# Patient Record
Sex: Female | Born: 1955 | Race: Black or African American | Hispanic: No | Marital: Single | Smoking: Never smoker
Health system: Southern US, Community
[De-identification: ages and names within clinical notes are randomized; demographics above are authoritative.]

## PROBLEM LIST (undated history)

## (undated) DIAGNOSIS — G43909 Migraine, unspecified, not intractable, without status migrainosus: Secondary | ICD-10-CM

## (undated) DIAGNOSIS — E785 Hyperlipidemia, unspecified: Secondary | ICD-10-CM

## (undated) DIAGNOSIS — M5030 Other cervical disc degeneration, unspecified cervical region: Secondary | ICD-10-CM

## (undated) DIAGNOSIS — Z9889 Other specified postprocedural states: Secondary | ICD-10-CM

## (undated) DIAGNOSIS — D352 Benign neoplasm of pituitary gland: Secondary | ICD-10-CM

## (undated) DIAGNOSIS — R112 Nausea with vomiting, unspecified: Secondary | ICD-10-CM

## (undated) DIAGNOSIS — K5792 Diverticulitis of intestine, part unspecified, without perforation or abscess without bleeding: Secondary | ICD-10-CM

## (undated) DIAGNOSIS — R011 Cardiac murmur, unspecified: Secondary | ICD-10-CM

## (undated) DIAGNOSIS — M1990 Unspecified osteoarthritis, unspecified site: Secondary | ICD-10-CM

## (undated) DIAGNOSIS — E559 Vitamin D deficiency, unspecified: Secondary | ICD-10-CM

## (undated) DIAGNOSIS — H269 Unspecified cataract: Secondary | ICD-10-CM

## (undated) DIAGNOSIS — I10 Essential (primary) hypertension: Secondary | ICD-10-CM

## (undated) HISTORY — DX: Other cervical disc degeneration, unspecified cervical region: M50.30

## (undated) HISTORY — DX: Hyperlipidemia, unspecified: E78.5

## (undated) HISTORY — DX: Benign neoplasm of pituitary gland: D35.2

## (undated) HISTORY — DX: Vitamin D deficiency, unspecified: E55.9

## (undated) HISTORY — PX: EYE SURGERY: SHX253

## (undated) HISTORY — PX: OTHER SURGICAL HISTORY: SHX169

## (undated) HISTORY — PX: TUBAL LIGATION: SHX77

## (undated) HISTORY — PX: CHOLECYSTECTOMY: SHX55

---

## 2004-01-15 ENCOUNTER — Ambulatory Visit: Payer: Self-pay | Admitting: Internal Medicine

## 2004-03-02 ENCOUNTER — Ambulatory Visit: Payer: Self-pay | Admitting: Internal Medicine

## 2004-09-14 ENCOUNTER — Inpatient Hospital Stay: Payer: Self-pay | Admitting: Internal Medicine

## 2004-09-14 ENCOUNTER — Other Ambulatory Visit: Payer: Self-pay

## 2004-12-01 ENCOUNTER — Ambulatory Visit: Payer: Self-pay | Admitting: Unknown Physician Specialty

## 2005-04-26 ENCOUNTER — Ambulatory Visit: Payer: Self-pay | Admitting: Internal Medicine

## 2006-04-29 ENCOUNTER — Ambulatory Visit: Payer: Self-pay | Admitting: Internal Medicine

## 2007-05-01 ENCOUNTER — Ambulatory Visit: Payer: Self-pay | Admitting: Internal Medicine

## 2008-05-02 ENCOUNTER — Ambulatory Visit: Payer: Self-pay | Admitting: Internal Medicine

## 2009-05-06 ENCOUNTER — Ambulatory Visit: Payer: Self-pay | Admitting: Internal Medicine

## 2010-05-08 ENCOUNTER — Ambulatory Visit: Payer: Self-pay | Admitting: Internal Medicine

## 2011-02-18 ENCOUNTER — Emergency Department: Payer: Self-pay | Admitting: Unknown Physician Specialty

## 2011-02-19 LAB — CBC
HCT: 37 % (ref 35.0–47.0)
HGB: 12.1 g/dL (ref 12.0–16.0)
MCH: 28.7 pg (ref 26.0–34.0)
MCHC: 32.7 g/dL (ref 32.0–36.0)
RBC: 4.21 10*6/uL (ref 3.80–5.20)
WBC: 9.6 10*3/uL (ref 3.6–11.0)

## 2011-02-19 LAB — COMPREHENSIVE METABOLIC PANEL
Anion Gap: 10 (ref 7–16)
Calcium, Total: 9.1 mg/dL (ref 8.5–10.1)
Chloride: 106 mmol/L (ref 98–107)
Co2: 29 mmol/L (ref 21–32)
EGFR (African American): >60
Osmolality: 288 (ref 275–301)
Potassium: 3.4 mmol/L — ABNORMAL LOW (ref 3.5–5.1)
Sodium: 145 mmol/L (ref 136–145)
Total Protein: 7.6 g/dL (ref 6.4–8.2)

## 2011-02-21 ENCOUNTER — Ambulatory Visit: Payer: Self-pay | Admitting: Ophthalmology

## 2011-06-03 ENCOUNTER — Ambulatory Visit: Payer: Self-pay | Admitting: Obstetrics and Gynecology

## 2012-06-13 ENCOUNTER — Ambulatory Visit: Payer: Self-pay | Admitting: Obstetrics and Gynecology

## 2012-07-11 ENCOUNTER — Ambulatory Visit: Payer: Self-pay | Admitting: Ophthalmology

## 2013-05-11 LAB — HM PAP SMEAR: HM PAP: NEGATIVE

## 2013-06-29 ENCOUNTER — Ambulatory Visit: Payer: Self-pay | Admitting: Obstetrics and Gynecology

## 2014-05-03 NOTE — Op Note (Signed)
PATIENT NAME:  LIN, HACKMANN MR#:  [id number] DATE OF BIRTH:  [birth year]  DATE OF PROCEDURE:  07/11/2012  PREOPERATIVE DIAGNOSIS:  Visually significant cataract of the left eye.   POSTOPERATIVE DIAGNOSIS: Visually significant cataract of the left eye.   OPERATIVE PROCEDURE:  Cataract extraction by phacoemulsification with implant of intraocular lens to the left eye.   SURGEON:  Birder Robson, MD  ANESTHESIA:  1. Managed anesthesia care.  2. 50-50 mixture of 0.75% bupivacaine and 4% Xylocaine given as a retrobulbar block.   COMPLICATIONS:  None.   TECHNIQUE:  Stop and chop.  DESCRIPTION OF PROCEDURE:  The patient was examined and consented for this procedure in the preoperative holding area and then brought back to the Operating Room where the anesthesia team employed managed anesthesia care.  3.5 milliliters of the aforementioned mixture were placed in the left orbit on an Atkinson needle without complication. The left eye was then prepped and draped in the usual sterile ophthalmic fashion. A lid speculum was placed. The side-port blade was used to create a paracentesis and the anterior chamber was filled with viscoelastic. The keratome was used to create a near clear corneal incision. The continuous curvilinear capsulorrhexis was performed with a cystotome followed by the capsulorrhexis forceps. Hydrodissection and hydrodelineation were carried out with BSS on a blunt cannula. The lens was removed in a stop and chop technique. The remaining cortical material was removed with the irrigation-aspiration handpiece. The capsular bag was inflated with viscoelastic and the Tecnis ZCB00 22.5 diopter lens, serial number [id number], was placed in the capsular bag without complication. The remaining viscoelastic was removed from the eye with the irrigation-aspiration handpiece. The wounds were hydrated. The anterior chamber was flushed with Miostat and the eye was inflated to a physiologic pressure. 0.1  mL of cefuroxime concentration 10 mg/mL was placed in the anterior chamber. The wounds were found to be water tight. The eye was dressed with Vigamox followed by Maxitrol ointment and a protective shield was placed. The patient will follow up with me in one day.    ____________________________ Livingston Diones. Ferdinando Lodge, MD wlp:si D: 07/11/2012 17:26:35 ET T: 07/11/2012 20:33:02 ET JOB#: [id number]  cc: Hamp Moreland L. Willisha Sligar, MD, <Dictator> Livingston Diones Mayte Diers MD ELECTRONICALLY SIGNED 07/12/2012 15:58

## 2014-05-05 NOTE — Op Note (Signed)
PATIENT NAME:  Morgan Lam, Morgan Lam MR#:  [id number] DATE OF BIRTH:  November 14, 1955  DATE OF PROCEDURE:  02/21/2011  PROCEDURES PERFORMED:  1. Pars plana vitrectomy of the left eye.  2. Gas exchange of the left eye.  3. Endolaser of the left eye.  4. Retinal detachment repair, left eye.   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  PRIMARY SURGEON: Teresa Pelton. Zaquan Duffner, MD    ANESTHESIA: Retrobulbar block of the left eye with monitored anesthesia care.   COMPLICATIONS: None.   INDICATIONS FOR PROCEDURE: This is a patient who presented to my office with loss of central vision for the past 24 to 48 hours. Examination revealed a rhegmatogenous retinal detachment with macula detached. Risks, benefits, and alternatives of the above procedure were discussed and the patient wished to proceed.   DETAILS OF PROCEDURE: After informed consent was obtained, the patient was brought in the operative suite at [facility]. The patient was placed in supine position, was given a small dose of propofol and a retrobulbar block was performed on the left eye by the primary surgeon without any complications. Left eye was prepped and draped in a sterile manner. After lid speculum was inserted, a 25-gauge trocar was placed inferotemporally through displaced conjunctiva in an oblique fashion 4 mm beyond the limbus. The infusion cannula was turned on and inserted through the trocar and secured in position with Steri-Strips. Two more trocars were placed in a similar fashion superonasally and superotemporally. Vitreous cutter and light pipe were introduced in the eye and a core vitrectomy was performed. Peripheral vitrectomy was performed out to the ora serrata over the area of attached retina. The vitrectomy was switched to shave duty cycle and peripheral vitreous was trimmed over  the area of the retinal detachment out to the ora serrata. Three retinal tears could be identified superotemporally. The flaps for these tears were amputated using the vitreous cutter. Endo cautery was used to mark each of the retinal tears. A posterior draining retinotomy was created superotemporal to the superior arcade. An air-fluid exchange was performed through the posterior draining retinotomy and the retina completely flattened. Endolaser was introduced in the eye and four rows of laser was placed around each of the original retinal tears. Endolaser was then carried for 360 degrees with care to spare long ciliary nerves. This was given 2 to 3 rows just posterior to the ora serrata given her multiple areas of lattice. Once this was completed, any remnant fluid was drained from the posterior draining retinotomy to flatten the posterior retina. Four rows of laser was placed around the draining posterior retinotomy and any remnant fluid was removed from the posterior that had been left in order to protect the optic nerve from the air stream. Once this was completed, 22% SF6 was used as an Acupuncturist. The trocars were removed and all but one wound was noted to be airtight. The superotemporal wound was closed using a single interrupted 6-0 plain gut. Once this was done, 5 mg of dexamethasone was given into the inferior fornix and the lid speculum was removed. The eye was cleaned and pressure in the eye was confirmed to be approximately 15 mmHg. TobraDex was placed on the eye and a patch and shield was placed over the eye. The patient was taken to postanesthesia care with instructions to remain head up with a right tilt or to lie  on her right side.   ____________________________ Teresa Pelton. Starling Manns, MD mfa:drc D: 02/21/2011 10:33:55 ET T: 02/21/2011 10:45:29 ET JOB#: [id number]  cc: Teresa Pelton. Starling Manns, MD, <Dictator> Coralee Rud MD ELECTRONICALLY SIGNED 03/03/2011 7:11

## 2014-05-30 ENCOUNTER — Other Ambulatory Visit: Payer: Self-pay | Admitting: Obstetrics and Gynecology

## 2014-05-30 DIAGNOSIS — Z01419 Encounter for gynecological examination (general) (routine) without abnormal findings: Secondary | ICD-10-CM

## 2014-07-01 ENCOUNTER — Ambulatory Visit
Admission: RE | Admit: 2014-07-01 | Discharge: 2014-07-01 | Disposition: A | Discharge status: Home / Self Care | Payer: BC Managed Care – PPO | Source: Ambulatory Visit | Attending: Obstetrics and Gynecology | Admitting: Obstetrics and Gynecology

## 2014-07-01 ENCOUNTER — Other Ambulatory Visit: Payer: Self-pay | Admitting: Obstetrics and Gynecology

## 2014-07-01 DIAGNOSIS — Z1231 Encounter for screening mammogram for malignant neoplasm of breast: Secondary | ICD-10-CM

## 2014-07-01 DIAGNOSIS — Z01419 Encounter for gynecological examination (general) (routine) without abnormal findings: Secondary | ICD-10-CM

## 2014-12-03 ENCOUNTER — Encounter: Payer: Self-pay | Admitting: *Deleted

## 2014-12-04 ENCOUNTER — Ambulatory Visit: Payer: BC Managed Care – PPO | Admitting: Anesthesiology

## 2014-12-04 ENCOUNTER — Encounter: Payer: Self-pay | Admitting: *Deleted

## 2014-12-04 ENCOUNTER — Ambulatory Visit
Admission: RE | Admit: 2014-12-04 | Discharge: 2014-12-04 | Disposition: A | Discharge status: Home / Self Care | Payer: BC Managed Care – PPO | Source: Ambulatory Visit | Attending: Unknown Physician Specialty | Admitting: Unknown Physician Specialty

## 2014-12-04 ENCOUNTER — Encounter
Admission: RE | Disposition: A | Discharge status: Home / Self Care | Payer: Self-pay | Source: Ambulatory Visit | Attending: Unknown Physician Specialty

## 2014-12-04 DIAGNOSIS — K621 Rectal polyp: Secondary | ICD-10-CM | POA: Diagnosis not present

## 2014-12-04 DIAGNOSIS — K5730 Diverticulosis of large intestine without perforation or abscess without bleeding: Secondary | ICD-10-CM | POA: Insufficient documentation

## 2014-12-04 DIAGNOSIS — K640 First degree hemorrhoids: Secondary | ICD-10-CM | POA: Insufficient documentation

## 2014-12-04 DIAGNOSIS — Z1211 Encounter for screening for malignant neoplasm of colon: Secondary | ICD-10-CM | POA: Diagnosis present

## 2014-12-04 DIAGNOSIS — Z7982 Long term (current) use of aspirin: Secondary | ICD-10-CM | POA: Insufficient documentation

## 2014-12-04 HISTORY — DX: Diverticulitis of intestine, part unspecified, without perforation or abscess without bleeding: K57.92

## 2014-12-04 HISTORY — DX: Migraine, unspecified, not intractable, without status migrainosus: G43.909

## 2014-12-04 HISTORY — DX: Unspecified cataract: H26.9

## 2014-12-04 HISTORY — PX: COLONOSCOPY WITH PROPOFOL: SHX5780

## 2014-12-04 SURGERY — COLONOSCOPY WITH PROPOFOL
Anesthesia: General

## 2014-12-04 MED ORDER — PROPOFOL 500 MG/50ML IV EMUL
INTRAVENOUS | Status: DC | PRN
  Administered 2014-12-04: 160 ug/kg/min via INTRAVENOUS

## 2014-12-04 MED ORDER — LIDOCAINE HCL (CARDIAC) 20 MG/ML IV SOLN
INTRAVENOUS | Status: DC | PRN
Start: 2014-12-04 — End: 2014-12-04
  Administered 2014-12-04: 80 mg via INTRAVENOUS

## 2014-12-04 MED ORDER — FENTANYL CITRATE (PF) 100 MCG/2ML IJ SOLN
INTRAMUSCULAR | Status: DC | PRN
  Administered 2014-12-04: 50 ug via INTRAVENOUS

## 2014-12-04 MED ORDER — SODIUM CHLORIDE 0.9 % IV SOLN
INTRAVENOUS | Status: DC
  Administered 2014-12-04: 1000 mL via INTRAVENOUS

## 2014-12-04 MED ORDER — SODIUM CHLORIDE 0.9 % IV SOLN: INTRAVENOUS | Status: DC

## 2014-12-04 NOTE — Anesthesia Preprocedure Evaluation (Signed)
Anesthesia Evaluation  Patient identified by MRN, date of birth, ID band Patient awake    Reviewed: Allergy & Precautions, H&P , NPO status , Patient's Chart, lab work & pertinent test results, reviewed documented beta blocker date and time   History of Anesthesia Complications Negative for: history of anesthetic complications  Airway Mallampati: I  TM Distance: >3 FB Neck ROM: full    Dental no notable dental hx. (+) Teeth Intact   Pulmonary neg pulmonary ROS,    Pulmonary exam normal breath sounds clear to auscultation       Cardiovascular Exercise Tolerance: Good negative cardio ROS Normal cardiovascular exam Rhythm:regular Rate:Normal     Neuro/Psych  Headaches (many years ago, none recently), neg Seizures negative neurological ROS  negative psych ROS   GI/Hepatic negative GI ROS, Neg liver ROS,   Endo/Other  negative endocrine ROS  Renal/GU negative Renal ROS  negative genitourinary   Musculoskeletal   Abdominal   Peds  Hematology negative hematology ROS (+)   Anesthesia Other Findings Past Medical History:   Diverticulitis                                               Cortical cataract                                            Migraines                                                    Reproductive/Obstetrics negative OB ROS                             Anesthesia Physical Anesthesia Plan  ASA: I  Anesthesia Plan: General   Post-op Pain Management:    Induction:   Airway Management Planned:   Additional Equipment:   Intra-op Plan:   Post-operative Plan:   Informed Consent: I have reviewed the patients History and Physical, chart, labs and discussed the procedure including the risks, benefits and alternatives for the proposed anesthesia with the patient or authorized representative who has indicated his/her understanding and acceptance.   Dental Advisory  Given  Plan Discussed with: Anesthesiologist, CRNA and Surgeon  Anesthesia Plan Comments:         Anesthesia Quick Evaluation

## 2014-12-04 NOTE — Op Note (Signed)
[facility] Patient Name: Morgan Lam Procedure Date: 12/04/2014 9:04 AM MRN: [id number] Account #: [id number] Date of Birth: 1955/03/30 Admit Type: Outpatient Age: 59 Room: [facility] ENDO ROOM 4 Gender: Female Note Status: Finalized Procedure:         Colonoscopy Indications:       Screening for colorectal malignant neoplasm Providers:         Manya Silvas, MD Referring MD:      Irven Easterly. Kary Kos, MD (Referring MD) Medicines:         Propofol per Anesthesia Complications:     No immediate complications. Procedure:         Pre-Anesthesia Assessment:                    - After reviewing the risks and benefits, the patient was                     deemed in satisfactory condition to undergo the procedure.                    After obtaining informed consent, the colonoscope was                     passed under direct vision. Throughout the procedure, the                     patient's blood pressure, pulse, and oxygen saturations                     were monitored continuously. The Colonoscope was                     introduced through the anus and advanced to the the cecum,                     identified by appendiceal orifice and ileocecal valve. The                     colonoscopy was performed without difficulty. The patient                     tolerated the procedure well. The quality of the bowel                     preparation was excellent. Findings:      A small polyp was found in the rectum. The polyp was sessile. The polyp       was removed with a hot snare. Resection and retrieval were complete.      Multiple small and large-mouthed diverticula were found in the ascending       colon.      Internal hemorrhoids were found during endoscopy. The hemorrhoids were       small and Grade I (internal hemorrhoids that do not prolapse).      The exam was otherwise without abnormality. Impression:        - One small polyp in the rectum. Resected  and retrieved.                    - Diverticulosis in the ascending colon.                    - Internal hemorrhoids.                    -  The examination was otherwise normal. Recommendation:    - Await pathology results. Manya Silvas, MD 12/04/2014 9:36:29 AM This report has been signed electronically. Number of Addenda: 0 Note Initiated On: 12/04/2014 9:04 AM Scope Withdrawal Time: 0 hours 10 minutes 54 seconds  Total Procedure Duration: 0 hours 16 minutes 10 seconds       [facility]

## 2014-12-04 NOTE — H&P (Signed)
   Primary Care Physician:  Minette Headland, NP Primary Gastroenterologist:  Dr. Vira Agar  Pre-Procedure History & Physical: HPI:  Morgan Lam is a 59 y.o. female is here for an colonoscopy.   Past Medical History  Diagnosis Date  . Diverticulitis   . Cortical cataract   . Migraines     Past Surgical History  Procedure Laterality Date  . Cholecystectomy    . Tubal ligation    . Degenerative disc    . Eye surgery      cataract os    Prior to Admission medications   Medication Sig Start Date End Date Taking? Authorizing Provider  aspirin 81 MG tablet Take 81 mg by mouth daily.   Yes Historical Provider, MD  Multiple Vitamin (MULTIVITAMIN) tablet Take 1 tablet by mouth daily.   Yes Historical Provider, MD  Vitamin D, Ergocalciferol, (DRISDOL) 50000 UNITS CAPS capsule Take 50,000 Units by mouth every 7 (seven) days.   Yes Historical Provider, MD    Allergies as of 11/11/2014  . (Not on File)    History reviewed. No pertinent family history.  Social History   Social History  . Marital Status: Single    Spouse Name: N/A  . Number of Children: N/A  . Years of Education: N/A   Occupational History  . Not on file.   Social History Main Topics  . Smoking status: Never Smoker   . Smokeless tobacco: Not on file  . Alcohol Use: Not on file  . Drug Use: Not on file  . Sexual Activity: Not on file   Other Topics Concern  . Not on file   Social History Narrative    Review of Systems: See HPI, otherwise negative ROS  Physical Exam: BP 150/86 mmHg  Pulse 79  Temp(Src) 98.1 F (36.7 C) (Oral)  Resp 16  Ht 5\' 7"  (1.702 m)  Wt 61.236 kg (135 lb)  BMI 21.14 kg/m2  SpO2 100% General:   Alert,  pleasant and cooperative in NAD Head:  Normocephalic and atraumatic. Neck:  Supple; no masses or thyromegaly. Lungs:  Clear throughout to auscultation.    Heart:  Regular rate and rhythm. Abdomen:  Soft, nontender and nondistended. Normal bowel sounds, without  guarding, and without rebound.   Neurologic:  Alert and  oriented x4;  grossly normal neurologically.  Impression/Plan: KATRESE PETILLO is here for an colonoscopy to be performed for screening colonoscopy  Risks, benefits, limitations, and alternatives regarding  colonoscopy have been reviewed with the patient.  Questions have been answered.  All parties agreeable.   Gaylyn Cheers, MD  12/04/2014, 9:02 AM

## 2014-12-04 NOTE — Transfer of Care (Signed)
Immediate Anesthesia Transfer of Care Note  Patient: Morgan Lam  Procedure(s) Performed: Procedure(s): COLONOSCOPY WITH PROPOFOL (N/A)  Patient Location: PACU and Endoscopy Unit  Anesthesia Type:General  Level of Consciousness: sedated  Airway & Oxygen Therapy: Patient Spontanous Breathing and Patient connected to nasal cannula oxygen  Post-op Assessment: Report given to RN and Post -op Vital signs reviewed and stable  Post vital signs: Reviewed and stable  Last Vitals: 9:37 70 hr 14resp 100% sat 122/72 96.9 temp Filed Vitals:   12/04/14 0814  BP: 150/86  Pulse: 79  Temp: 36.7 C  Resp: 16    Complications: No apparent anesthesia complications

## 2014-12-04 NOTE — Anesthesia Postprocedure Evaluation (Signed)
Anesthesia Post Note  Patient: Morgan Lam  Procedure(s) Performed: Procedure(s) (LRB): COLONOSCOPY WITH PROPOFOL (N/A)  Patient location during evaluation: PACU Anesthesia Type: General Level of consciousness: awake and alert Pain management: pain level controlled Vital Signs Assessment: post-procedure vital signs reviewed and stable Respiratory status: spontaneous breathing, nonlabored ventilation, respiratory function stable and patient connected to nasal cannula oxygen Cardiovascular status: blood pressure returned to baseline and stable Postop Assessment: No signs of nausea or vomiting Anesthetic complications: no    Last Vitals:  Filed Vitals:   12/04/14 0956 12/04/14 1006  BP: 147/81 155/74  Pulse: 63 68  Temp:    Resp: 12 12    Last Pain: There were no vitals filed for this visit.               Martha Clan

## 2014-12-06 LAB — SURGICAL PATHOLOGY

## 2014-12-09 ENCOUNTER — Encounter: Payer: Self-pay | Admitting: Unknown Physician Specialty

## 2015-06-05 ENCOUNTER — Encounter: Payer: Self-pay | Admitting: Obstetrics and Gynecology

## 2015-07-03 ENCOUNTER — Encounter: Payer: Self-pay | Admitting: Obstetrics and Gynecology

## 2015-07-21 ENCOUNTER — Other Ambulatory Visit: Payer: Self-pay | Admitting: Adult Health

## 2015-07-21 DIAGNOSIS — Z1231 Encounter for screening mammogram for malignant neoplasm of breast: Secondary | ICD-10-CM

## 2015-07-23 ENCOUNTER — Ambulatory Visit (INDEPENDENT_AMBULATORY_CARE_PROVIDER_SITE_OTHER): Payer: BC Managed Care – PPO | Admitting: Obstetrics and Gynecology

## 2015-07-23 ENCOUNTER — Encounter: Payer: Self-pay | Admitting: Obstetrics and Gynecology

## 2015-07-23 VITALS — BP 168/69 | HR 73 | Ht 67.0 in | Wt 135.7 lb

## 2015-07-23 DIAGNOSIS — Z1211 Encounter for screening for malignant neoplasm of colon: Secondary | ICD-10-CM

## 2015-07-23 DIAGNOSIS — N952 Postmenopausal atrophic vaginitis: Secondary | ICD-10-CM | POA: Diagnosis not present

## 2015-07-23 DIAGNOSIS — Z780 Asymptomatic menopausal state: Secondary | ICD-10-CM | POA: Diagnosis not present

## 2015-07-23 DIAGNOSIS — Z0000 Encounter for general adult medical examination without abnormal findings: Secondary | ICD-10-CM

## 2015-07-23 DIAGNOSIS — Z01419 Encounter for gynecological examination (general) (routine) without abnormal findings: Secondary | ICD-10-CM

## 2015-07-23 NOTE — Patient Instructions (Signed)
1. No Pap smear needed until 2018 2. Mammogram already scheduled 3. Stool guaiac cards for colon cancer screening given 4. Continue with calcium and vitamin D supplementation 5. Continue with healthy eating and exercise. 6. Return in 1 year for annual exam

## 2015-07-23 NOTE — Progress Notes (Signed)
Patient ID: Morgan Lam, female   DOB: [birth year], 60 y.o.   MRN: [id number] ANNUAL PREVENTATIVE CARE GYN  ENCOUNTER NOTE  Subjective:       Morgan Lam is a 60 y.o. G1P1001 female here for a routine annual gynecologic exam.  Current complaints: 1.  none    Gynecologic History No LMP recorded. Patient is postmenopausal. Contraception: post menopausal status Last Pap: 2015. Results were: normal Last mammogram: 2016. Results were: normal  Obstetric History OB History  Gravida Para Term Preterm AB SAB TAB Ectopic Multiple Living  1 1 1       1     # Outcome Date GA Lbr Len/2nd Weight Sex Delivery Anes PTL Lv  1 Term 1991   7 lb (3.175 kg) M VBAC   Y      Past Medical History  Diagnosis Date  . Diverticulitis   . Cortical cataract   . Migraines   . Hyperlipemia   . DDD (degenerative disc disease), cervical   . Pituitary adenoma ([place])   . Vitamin D deficiency     Past Surgical History  Procedure Laterality Date  . Cholecystectomy    . Tubal ligation    . Degenerative disc    . Eye surgery      cataract os  . Colonoscopy with propofol N/A 12/04/2014    Procedure: COLONOSCOPY WITH PROPOFOL;  Surgeon: Manya Silvas, MD;  Location: [facility] ENDOSCOPY;  Service: Endoscopy;  Laterality: N/A;    Current Outpatient Prescriptions on File Prior to Visit  Medication Sig Dispense Refill  . aspirin 81 MG tablet Take 81 mg by mouth daily.    . Multiple Vitamin (MULTIVITAMIN) tablet Take 1 tablet by mouth daily.    . Vitamin D, Ergocalciferol, (DRISDOL) 50000 UNITS CAPS capsule Take 50,000 Units by mouth every 7 (seven) days.     No current facility-administered medications on file prior to visit.    Allergies  Allergen Reactions  . Codeine   . Ivp Dye [Iodinated Diagnostic Agents]     Social History   Social History  . Marital Status: Single    Spouse Name: N/A  . Number of Children: N/A  . Years of Education: N/A   Occupational History  . Not on file.   Social  History Main Topics  . Smoking status: Never Smoker   . Smokeless tobacco: Not on file  . Alcohol Use: Yes     Comment: occas  . Drug Use: Yes    Special: Marijuana  . Sexual Activity: Yes   Other Topics Concern  . Not on file   Social History Narrative    Family History  Problem Relation Age of Onset  . Diabetes Maternal Uncle   . Stomach cancer Maternal Uncle   . Cancer Neg Hx     The following portions of the patient's history were reviewed and updated as appropriate: allergies, current medications, past family history, past medical history, past social history, past surgical history and problem list.  Review of Systems ROS Review of Systems - General ROS: negative for - chills, fatigue, fever, hot flashes, night sweats, weight gain or weight loss Psychological ROS: negative for - anxiety, decreased libido, depression, mood swings, physical abuse or sexual abuse Ophthalmic ROS: negative for - blurry vision, eye pain or loss of vision ENT ROS: negative for - headaches, hearing change, visual changes or vocal changes Allergy and Immunology ROS: negative for - hives, itchy/watery eyes or seasonal allergies Hematological and Lymphatic ROS:  negative for - bleeding problems, bruising, swollen lymph nodes or weight loss Endocrine ROS: negative for - galactorrhea, hair pattern changes, hot flashes, malaise/lethargy, mood swings, palpitations, polydipsia/polyuria, skin changes, temperature intolerance or unexpected weight changes Breast ROS: negative for - new or changing breast lumps or nipple discharge Respiratory ROS: negative for - cough or shortness of breath Cardiovascular ROS: negative for - chest pain, irregular heartbeat, palpitations or shortness of breath Gastrointestinal ROS: no abdominal pain, change in bowel habits, or black or bloody stools Genito-Urinary ROS: no dysuria, trouble voiding, or hematuria Musculoskeletal ROS: negative for - joint pain or joint  stiffness Neurological ROS: negative for - bowel and bladder control changes Dermatological ROS: negative for rash and skin lesion changes   Objective:   BP 168/69 mmHg  Pulse 73  Ht 5\' 7"  (1.702 m)  Wt 135 lb 11.2 oz (61.553 kg)  BMI 21.25 kg/m2 CONSTITUTIONAL: Well-developed, well-nourished female in no acute distress.  PSYCHIATRIC: Normal mood and affect. Normal behavior. Normal judgment and thought content. [place]: Alert and oriented to person, place, and time. Normal muscle tone coordination. No cranial nerve deficit noted. HENT:  Normocephalic, atraumatic, External right and left ear normal. Oropharynx is clear and moist EYES: Conjunctivae and EOM are normal. Pupils are equal, round, and reactive to light. No scleral icterus.  NECK: Normal range of motion, supple, no masses.  Normal thyroid.  SKIN: Skin is warm and dry. No rash noted. Not diaphoretic. No erythema. No pallor. CARDIOVASCULAR: Normal heart rate noted, regular rhythm, no murmur. RESPIRATORY: Clear to auscultation bilaterally. Effort and breath sounds normal, no problems with respiration noted. BREASTS: Symmetric in size. No masses, skin changes, nipple drainage, or lymphadenopathy. ABDOMEN: Soft, normal bowel sounds, no distention noted.  No tenderness, rebound or guarding.  BLADDER: Normal PELVIC:  External Genitalia: Normal  BUS: Normal  Vagina: Moderate vaginal atrophy  Cervix: Atrophic; no lesions  Uterus: Small, mobile, nontender  Adnexa: Normal  RV: External Exam NormaI, No Rectal Masses and Normal Sphincter tone  MUSCULOSKELETAL: Normal range of motion. No tenderness.  No cyanosis, clubbing, or edema.  2+ distal pulses. LYMPHATIC: No Axillary, Supraclavicular, or Inguinal Adenopathy.    Assessment:   Annual gynecologic examination 60 y.o. Contraception: post menopausal status Normal BMI Problem List Items Addressed This Visit    None      Plan:  Pap: Not needed Mammogram: scheduled  07/2014 Stool Guaiac Testing:  Ordered Labs: Per primary care Routine preventative health maintenance measures emphasized: Exercise/Diet/Weight control, Tobacco Warnings and Alcohol/Substance use risks Return to [place], CMA  Brayton Mars, MD   Note: This dictation was prepared with Dragon dictation along with smaller phrase technology. Any transcriptional errors that result from this process are unintentional.

## 2015-08-08 ENCOUNTER — Other Ambulatory Visit: Payer: Self-pay | Admitting: Adult Health

## 2015-08-08 ENCOUNTER — Ambulatory Visit
Admission: RE | Admit: 2015-08-08 | Discharge: 2015-08-08 | Disposition: A | Discharge status: Home / Self Care | Payer: BC Managed Care – PPO | Source: Ambulatory Visit | Attending: Adult Health | Admitting: Adult Health

## 2015-08-08 DIAGNOSIS — Z1231 Encounter for screening mammogram for malignant neoplasm of breast: Secondary | ICD-10-CM | POA: Diagnosis present

## 2015-08-09 LAB — FECAL OCCULT BLOOD, IMMUNOCHEMICAL: Fecal Occult Bld: NEGATIVE

## 2015-08-26 ENCOUNTER — Telehealth: Payer: Self-pay

## 2015-08-26 NOTE — Telephone Encounter (Signed)
Pt aware.

## 2015-08-26 NOTE — Telephone Encounter (Signed)
-----   Message from Brayton Mars, MD sent at 08/26/2015  6:41 AM EDT ----- Please Notify - Labs normal

## 2016-01-15 ENCOUNTER — Other Ambulatory Visit: Payer: Self-pay | Admitting: Family Medicine

## 2016-01-15 DIAGNOSIS — R29818 Other symptoms and signs involving the nervous system: Secondary | ICD-10-CM

## 2016-01-15 DIAGNOSIS — R202 Paresthesia of skin: Secondary | ICD-10-CM

## 2016-01-15 DIAGNOSIS — R29898 Other symptoms and signs involving the musculoskeletal system: Secondary | ICD-10-CM

## 2016-01-23 ENCOUNTER — Other Ambulatory Visit
Admission: RE | Admit: 2016-01-23 | Discharge: 2016-01-23 | Disposition: A | Discharge status: Home / Self Care | Payer: BC Managed Care – PPO | Source: Ambulatory Visit | Attending: Family Medicine | Admitting: Family Medicine

## 2016-01-23 ENCOUNTER — Ambulatory Visit
Admission: RE | Admit: 2016-01-23 | Discharge: 2016-01-23 | Disposition: A | Discharge status: Home / Self Care | Payer: BC Managed Care – PPO | Source: Ambulatory Visit | Attending: Family Medicine | Admitting: Family Medicine

## 2016-01-23 DIAGNOSIS — R202 Paresthesia of skin: Secondary | ICD-10-CM | POA: Diagnosis not present

## 2016-01-23 DIAGNOSIS — R29898 Other symptoms and signs involving the musculoskeletal system: Secondary | ICD-10-CM | POA: Diagnosis present

## 2016-01-23 DIAGNOSIS — Z01812 Encounter for preprocedural laboratory examination: Secondary | ICD-10-CM | POA: Diagnosis present

## 2016-01-23 DIAGNOSIS — R29818 Other symptoms and signs involving the nervous system: Secondary | ICD-10-CM | POA: Insufficient documentation

## 2016-01-23 DIAGNOSIS — Z79899 Other long term (current) drug therapy: Secondary | ICD-10-CM | POA: Diagnosis not present

## 2016-01-23 LAB — CREATININE, SERUM
CREATININE: 0.73 mg/dL (ref 0.44–1.00)
GFR calc non Af Amer: >60 mL/min (ref >60)

## 2016-01-23 LAB — BUN: BUN: 9 mg/dL (ref 6–20)

## 2016-01-23 MED ORDER — GADOBENATE DIMEGLUMINE 529 MG/ML IV SOLN
12.0000 mL | Freq: Once | INTRAVENOUS | Status: AC | PRN
  Administered 2016-01-23: 12 mL via INTRAVENOUS

## 2016-02-13 ENCOUNTER — Other Ambulatory Visit: Payer: Self-pay | Admitting: Neurology

## 2016-02-13 DIAGNOSIS — R202 Paresthesia of skin: Secondary | ICD-10-CM

## 2016-02-14 DIAGNOSIS — M1611 Unilateral primary osteoarthritis, right hip: Secondary | ICD-10-CM | POA: Insufficient documentation

## 2016-02-17 ENCOUNTER — Ambulatory Visit
Admission: RE | Admit: 2016-02-17 | Discharge: 2016-02-17 | Disposition: A | Discharge status: Home / Self Care | Payer: BC Managed Care – PPO | Source: Ambulatory Visit | Attending: Neurology | Admitting: Neurology

## 2016-02-17 DIAGNOSIS — M47892 Other spondylosis, cervical region: Secondary | ICD-10-CM | POA: Diagnosis not present

## 2016-02-17 DIAGNOSIS — M5031 Other cervical disc degeneration,  high cervical region: Secondary | ICD-10-CM | POA: Insufficient documentation

## 2016-02-17 DIAGNOSIS — R202 Paresthesia of skin: Secondary | ICD-10-CM | POA: Diagnosis not present

## 2016-02-17 DIAGNOSIS — R938 Abnormal findings on diagnostic imaging of other specified body structures: Secondary | ICD-10-CM | POA: Diagnosis not present

## 2016-02-17 DIAGNOSIS — R9082 White matter disease, unspecified: Secondary | ICD-10-CM | POA: Insufficient documentation

## 2016-02-17 DIAGNOSIS — M5134 Other intervertebral disc degeneration, thoracic region: Secondary | ICD-10-CM | POA: Diagnosis not present

## 2016-03-01 ENCOUNTER — Ambulatory Visit
Admission: RE | Admit: 2016-03-01 | Discharge: 2016-03-01 | Disposition: A | Discharge status: Home / Self Care | Payer: BC Managed Care – PPO | Source: Ambulatory Visit | Attending: Neurosurgery | Admitting: Neurosurgery

## 2016-03-01 ENCOUNTER — Encounter
Admission: RE | Admit: 2016-03-01 | Discharge: 2016-03-01 | Disposition: A | Discharge status: Home / Self Care | Payer: BC Managed Care – PPO | Source: Ambulatory Visit | Attending: Neurosurgery | Admitting: Neurosurgery

## 2016-03-01 DIAGNOSIS — Z01810 Encounter for preprocedural cardiovascular examination: Secondary | ICD-10-CM | POA: Diagnosis not present

## 2016-03-01 DIAGNOSIS — R918 Other nonspecific abnormal finding of lung field: Secondary | ICD-10-CM | POA: Diagnosis not present

## 2016-03-01 DIAGNOSIS — Z01812 Encounter for preprocedural laboratory examination: Secondary | ICD-10-CM | POA: Insufficient documentation

## 2016-03-01 DIAGNOSIS — Z01818 Encounter for other preprocedural examination: Secondary | ICD-10-CM

## 2016-03-01 DIAGNOSIS — M5000 Cervical disc disorder with myelopathy, unspecified cervical region: Secondary | ICD-10-CM | POA: Insufficient documentation

## 2016-03-01 HISTORY — DX: Other specified postprocedural states: Z98.890

## 2016-03-01 HISTORY — DX: Nausea with vomiting, unspecified: R11.2

## 2016-03-01 LAB — DIFFERENTIAL
BASOS PCT: 1 %
Basophils Absolute: 0 10*3/uL (ref 0–0.1)
EOS PCT: 5 %
Eosinophils Absolute: 0.3 10*3/uL (ref 0–0.7)
Lymphocytes Relative: 39 %
Lymphs Abs: 2.5 10*3/uL (ref 1.0–3.6)
MONO ABS: 0.5 10*3/uL (ref 0.2–0.9)
Monocytes Relative: 8 %
NEUTROS ABS: 3 10*3/uL (ref 1.4–6.5)
Neutrophils Relative %: 47 %

## 2016-03-01 LAB — URINALYSIS, ROUTINE W REFLEX MICROSCOPIC
BILIRUBIN URINE: NEGATIVE
GLUCOSE, UA: 50 mg/dL — AB
HGB URINE DIPSTICK: NEGATIVE
Ketones, ur: NEGATIVE mg/dL
Leukocytes, UA: NEGATIVE
Nitrite: NEGATIVE
PH: 5 (ref 5.0–8.0)
Protein, ur: NEGATIVE mg/dL
SPECIFIC GRAVITY, URINE: 1.02 (ref 1.005–1.030)

## 2016-03-01 LAB — PROTIME-INR
INR: 1.02
PROTHROMBIN TIME: 13.4 s (ref 11.4–15.2)

## 2016-03-01 LAB — CBC
HCT: 36.7 % (ref 35.0–47.0)
Hemoglobin: 12 g/dL (ref 12.0–16.0)
MCH: 28.1 pg (ref 26.0–34.0)
MCHC: 32.7 g/dL (ref 32.0–36.0)
MCV: 85.9 fL (ref 80.0–100.0)
PLATELETS: 219 10*3/uL (ref 150–440)
RBC: 4.27 MIL/uL (ref 3.80–5.20)
RDW: 13.9 % (ref 11.5–14.5)
WBC: 6.4 10*3/uL (ref 3.6–11.0)

## 2016-03-01 LAB — BASIC METABOLIC PANEL
Anion gap: 7 (ref 5–15)
BUN: 10 mg/dL (ref 6–20)
CHLORIDE: 106 mmol/L (ref 101–111)
CO2: 29 mmol/L (ref 22–32)
CREATININE: 0.74 mg/dL (ref 0.44–1.00)
Calcium: 9.1 mg/dL (ref 8.9–10.3)
GFR calc Af Amer: >60 mL/min (ref >60)
GFR calc non Af Amer: >60 mL/min (ref >60)
Glucose, Bld: 72 mg/dL (ref 65–99)
Potassium: 3.2 mmol/L — ABNORMAL LOW (ref 3.5–5.1)
Sodium: 142 mmol/L (ref 135–145)

## 2016-03-01 LAB — TYPE AND SCREEN
ABO/RH(D): A POS
Antibody Screen: NEGATIVE

## 2016-03-01 LAB — APTT: aPTT: 32 seconds (ref 24–36)

## 2016-03-01 LAB — SURGICAL PCR SCREEN
MRSA, PCR: NEGATIVE
Staphylococcus aureus: NEGATIVE

## 2016-03-01 NOTE — Pre-Procedure Instructions (Signed)
Dr. Lacinda Axon ordered Ancef wt based, called Pharmacy, spoke with Violeta Gelinas RPh,  he calculated Ancef as 2 grams IVPB pre-op and 1 gram IVPB every 6 hours for 24 hours after surgery starting 2 hours after surgery.

## 2016-03-01 NOTE — Patient Instructions (Signed)
  Your procedure is scheduled [id number] Feb. 26 , 2018. Report to Same Day Surgery. To find out your arrival time please call [phone] between 1PM - 3PM on Friday Feb.23, 2018.  Remember: Instructions that are not followed completely may result in serious medical risk, up to and including death, or upon the discretion of your surgeon and anesthesiologist your surgery may need to be rescheduled.    _x___ 1. Do not eat food or drink liquids after midnight. No gum chewing or hard candies.     _x___ 2. No Alcohol for 24 hours before or after surgery.   ____ 3. Bring all medications with you on the day of surgery if instructed.    __x__ 4. Notify your doctor if there is any change in your medical condition     (cold, fever, infections).    _____ 5. No smoking 24 hours prior to surgery.     Do not wear jewelry, make-up, hairpins, clips or nail polish.  Do not wear lotions, powders, or perfumes.   Do not shave 48 hours prior to surgery. Men may shave face and neck.  Do not bring valuables to the hospital.    [facility] is not responsible for any belongings or valuables.               Contacts, dentures or bridgework may not be worn into surgery.  Leave your suitcase in the car. After surgery it may be brought to your room.  For patients admitted to the hospital, discharge time is determined by your treatment team.   Patients discharged the day of surgery will not be allowed to drive home.    Please read over the following fact sheets that you were given:   [facility] Preparing for Surgery  ____ Take these medicines the morning of surgery with A SIP OF WATER: None     ____ Fleet Enema (as directed)   __x__ Use CHG Soap as directed on instruction sheet  ____ Use inhalers on the day of surgery and bring to hospital day of surgery  ____ Stop metformin 2 days prior to surgery    ____ Take 1/2 of usual insulin dose the night before surgery and none on the morning of surgery.    _x___ Stop aspirin 1 week prior to surgery per Dr. Jonathon Jordan instructions.  _x___ Stop Anti-inflammatories such as Advil, Aleve, Ibuprofen, Motrin, Naproxen,  Naprosyn, Goodies powders or aspirin products. OK to take Tylenol.   ____ Stop supplements until after surgery.    ____ Bring C-Pap to the hospital.

## 2016-03-01 NOTE — Pre-Procedure Instructions (Signed)
Potassium level low at 3.2.  Faxed request for potassium supplement to [facility] at Dr. Geralynn Ochs office.  Spoke with Ophelia Shoulder, she will forward this information onto pt's PCP at [facility].

## 2016-03-07 MED ORDER — CEFAZOLIN SODIUM-DEXTROSE 2-4 GM/100ML-% IV SOLN
2.0000 g | Freq: Once | INTRAVENOUS | Status: AC
  Administered 2016-03-08: 2 g via INTRAVENOUS

## 2016-03-08 ENCOUNTER — Encounter: Payer: Self-pay | Admitting: *Deleted

## 2016-03-08 ENCOUNTER — Observation Stay
Admission: RE | Admit: 2016-03-08 | Discharge: 2016-03-09 | Disposition: A | Discharge status: Home / Self Care | Payer: BC Managed Care – PPO | Source: Ambulatory Visit | Attending: Neurosurgery | Admitting: Neurosurgery

## 2016-03-08 ENCOUNTER — Ambulatory Visit: Payer: BC Managed Care – PPO

## 2016-03-08 ENCOUNTER — Encounter
Admission: RE | Disposition: A | Discharge status: Home / Self Care | Payer: Self-pay | Source: Ambulatory Visit | Attending: Neurosurgery

## 2016-03-08 ENCOUNTER — Observation Stay: Payer: BC Managed Care – PPO

## 2016-03-08 ENCOUNTER — Ambulatory Visit: Payer: BC Managed Care – PPO | Admitting: Anesthesiology

## 2016-03-08 DIAGNOSIS — M4802 Spinal stenosis, cervical region: Secondary | ICD-10-CM | POA: Diagnosis not present

## 2016-03-08 DIAGNOSIS — Z9889 Other specified postprocedural states: Secondary | ICD-10-CM | POA: Diagnosis not present

## 2016-03-08 DIAGNOSIS — Z886 Allergy status to analgesic agent status: Secondary | ICD-10-CM | POA: Diagnosis not present

## 2016-03-08 DIAGNOSIS — Z9842 Cataract extraction status, left eye: Secondary | ICD-10-CM | POA: Diagnosis not present

## 2016-03-08 DIAGNOSIS — Z833 Family history of diabetes mellitus: Secondary | ICD-10-CM | POA: Diagnosis not present

## 2016-03-08 DIAGNOSIS — E559 Vitamin D deficiency, unspecified: Secondary | ICD-10-CM | POA: Insufficient documentation

## 2016-03-08 DIAGNOSIS — Z9049 Acquired absence of other specified parts of digestive tract: Secondary | ICD-10-CM | POA: Diagnosis not present

## 2016-03-08 DIAGNOSIS — Z800 Family history of malignant neoplasm of digestive organs: Secondary | ICD-10-CM | POA: Insufficient documentation

## 2016-03-08 DIAGNOSIS — Z7982 Long term (current) use of aspirin: Secondary | ICD-10-CM | POA: Insufficient documentation

## 2016-03-08 DIAGNOSIS — Z8639 Personal history of other endocrine, nutritional and metabolic disease: Secondary | ICD-10-CM | POA: Diagnosis not present

## 2016-03-08 DIAGNOSIS — Z91041 Radiographic dye allergy status: Secondary | ICD-10-CM | POA: Diagnosis not present

## 2016-03-08 DIAGNOSIS — M5000 Cervical disc disorder with myelopathy, unspecified cervical region: Secondary | ICD-10-CM | POA: Diagnosis present

## 2016-03-08 DIAGNOSIS — Z09 Encounter for follow-up examination after completed treatment for conditions other than malignant neoplasm: Secondary | ICD-10-CM

## 2016-03-08 DIAGNOSIS — Z419 Encounter for procedure for purposes other than remedying health state, unspecified: Secondary | ICD-10-CM

## 2016-03-08 DIAGNOSIS — G959 Disease of spinal cord, unspecified: Secondary | ICD-10-CM | POA: Diagnosis present

## 2016-03-08 DIAGNOSIS — M4322 Fusion of spine, cervical region: Secondary | ICD-10-CM

## 2016-03-08 HISTORY — PX: ANTERIOR CERVICAL DECOMP/DISCECTOMY FUSION: SHX1161

## 2016-03-08 LAB — POCT I-STAT 4, (NA,K, GLUC, HGB,HCT)
GLUCOSE: 106 mg/dL — AB (ref 65–99)
HEMATOCRIT: 37 % (ref 36.0–46.0)
HEMOGLOBIN: 12.6 g/dL (ref 12.0–15.0)
POTASSIUM: 3.4 mmol/L — AB (ref 3.5–5.1)
SODIUM: 143 mmol/L (ref 135–145)

## 2016-03-08 LAB — ABO/RH: ABO/RH(D): A POS

## 2016-03-08 SURGERY — ANTERIOR CERVICAL DECOMPRESSION/DISCECTOMY FUSION 3 LEVELS
Anesthesia: General | Site: Spine Cervical | Laterality: Left | Wound class: Clean

## 2016-03-08 MED ORDER — CEFAZOLIN SODIUM-DEXTROSE 2-3 GM-% IV SOLR
2.0000 g | Freq: Three times a day (TID) | INTRAVENOUS | Status: AC
  Administered 2016-03-08 (×2): 2 g via INTRAVENOUS
  Filled 2016-03-08 (×2): qty 50

## 2016-03-08 MED ORDER — SODIUM CHLORIDE 0.9 % IJ SOLN
INTRAMUSCULAR | Status: AC
  Filled 2016-03-08: qty 50

## 2016-03-08 MED ORDER — SODIUM CHLORIDE 0.9 % IV SOLN: 250.0000 mL | INTRAVENOUS | Status: DC

## 2016-03-08 MED ORDER — SODIUM CHLORIDE 0.9 % IJ SOLN
INTRAMUSCULAR | Status: AC
  Filled 2016-03-08: qty 10

## 2016-03-08 MED ORDER — OXYCODONE HCL 5 MG PO TABS
5.0000 mg | ORAL_TABLET | Freq: Four times a day (QID) | ORAL | Status: DC | PRN
  Administered 2016-03-08: 5 mg via ORAL
  Filled 2016-03-08: qty 1

## 2016-03-08 MED ORDER — MENTHOL 3 MG MT LOZG
1.0000 | LOZENGE | OROMUCOSAL | Status: DC | PRN
  Administered 2016-03-08: 3 mg via ORAL
  Filled 2016-03-08 (×2): qty 9

## 2016-03-08 MED ORDER — CEFAZOLIN SODIUM-DEXTROSE 2-4 GM/100ML-% IV SOLN
INTRAVENOUS | Status: AC
  Filled 2016-03-08: qty 100

## 2016-03-08 MED ORDER — LACTATED RINGERS IV SOLN
INTRAVENOUS | Status: DC
  Administered 2016-03-08: 07:00:00 via INTRAVENOUS

## 2016-03-08 MED ORDER — HYDRALAZINE HCL 20 MG/ML IJ SOLN: 10.0000 mg | Freq: Four times a day (QID) | INTRAMUSCULAR | Status: DC | PRN

## 2016-03-08 MED ORDER — ACETAMINOPHEN 325 MG PO TABS: 650.0000 mg | ORAL_TABLET | ORAL | Status: DC | PRN

## 2016-03-08 MED ORDER — PROPOFOL 10 MG/ML IV BOLUS
INTRAVENOUS | Status: DC | PRN
  Administered 2016-03-08: 50 mg via INTRAVENOUS
  Administered 2016-03-08: 100 mg via INTRAVENOUS
  Administered 2016-03-08: 50 mg via INTRAVENOUS

## 2016-03-08 MED ORDER — CEFAZOLIN SODIUM-DEXTROSE 2-4 GM/100ML-% IV SOLN
2.0000 g | Freq: Three times a day (TID) | INTRAVENOUS | Status: DC
  Filled 2016-03-08 (×2): qty 100

## 2016-03-08 MED ORDER — DEXAMETHASONE SODIUM PHOSPHATE 10 MG/ML IJ SOLN
INTRAMUSCULAR | Status: DC | PRN
  Administered 2016-03-08: 10 mg via INTRAVENOUS

## 2016-03-08 MED ORDER — THROMBIN 5000 UNITS EX KIT
PACK | CUTANEOUS | Status: DC | PRN
  Administered 2016-03-08 (×2): 5000 [IU] via TOPICAL

## 2016-03-08 MED ORDER — SODIUM CHLORIDE 0.9 % IV SOLN
INTRAVENOUS | Status: DC
  Administered 2016-03-08 – 2016-03-09 (×2): via INTRAVENOUS

## 2016-03-08 MED ORDER — GLYCOPYRROLATE 0.2 MG/ML IJ SOLN
INTRAMUSCULAR | Status: DC | PRN
  Administered 2016-03-08: 0.2 mg via INTRAVENOUS

## 2016-03-08 MED ORDER — ONDANSETRON HCL 4 MG/2ML IJ SOLN
4.0000 mg | INTRAMUSCULAR | Status: DC | PRN
  Administered 2016-03-08 (×2): 4 mg via INTRAVENOUS
  Filled 2016-03-08 (×2): qty 2

## 2016-03-08 MED ORDER — MIDAZOLAM HCL 2 MG/2ML IJ SOLN
INTRAMUSCULAR | Status: AC
  Filled 2016-03-08: qty 2

## 2016-03-08 MED ORDER — PROPOFOL 10 MG/ML IV BOLUS
INTRAVENOUS | Status: AC
  Filled 2016-03-08: qty 20

## 2016-03-08 MED ORDER — BACITRACIN 50000 UNITS IM SOLR
INTRAMUSCULAR | Status: DC | PRN
  Administered 2016-03-08: 1000 mL

## 2016-03-08 MED ORDER — SODIUM CHLORIDE 0.9 % IV SOLN
INTRAVENOUS | Status: DC | PRN
  Administered 2016-03-08: .2 ug/kg/min via INTRAVENOUS

## 2016-03-08 MED ORDER — FAMOTIDINE 20 MG PO TABS
ORAL_TABLET | ORAL | Status: AC
  Administered 2016-03-08: 20 mg via ORAL
  Filled 2016-03-08: qty 1

## 2016-03-08 MED ORDER — SODIUM CHLORIDE 0.9 % IV SOLN
INTRAVENOUS | Status: DC | PRN
  Administered 2016-03-08: 60 ug/min via INTRAVENOUS

## 2016-03-08 MED ORDER — METHOCARBAMOL 500 MG PO TABS: 500.0000 mg | ORAL_TABLET | Freq: Three times a day (TID) | ORAL | Status: DC | PRN

## 2016-03-08 MED ORDER — LIDOCAINE HCL (CARDIAC) 20 MG/ML IV SOLN
INTRAVENOUS | Status: DC | PRN
  Administered 2016-03-08: 60 mg via INTRAVENOUS
  Administered 2016-03-08: 40 mg via INTRAVENOUS

## 2016-03-08 MED ORDER — HYDROMORPHONE HCL 1 MG/ML IJ SOLN
INTRAMUSCULAR | Status: AC
  Administered 2016-03-08: 0.5 mg via INTRAVENOUS
  Filled 2016-03-08: qty 1

## 2016-03-08 MED ORDER — ACETAMINOPHEN 325 MG PO TABS
650.0000 mg | ORAL_TABLET | Freq: Four times a day (QID) | ORAL | Status: DC | PRN
  Administered 2016-03-09: 650 mg via ORAL
  Filled 2016-03-08: qty 2

## 2016-03-08 MED ORDER — ACETAMINOPHEN 10 MG/ML IV SOLN
INTRAVENOUS | Status: DC | PRN
  Administered 2016-03-08: 1000 mg via INTRAVENOUS

## 2016-03-08 MED ORDER — CEFAZOLIN SODIUM-DEXTROSE 2-4 GM/100ML-% IV SOLN
2.0000 g | Freq: Three times a day (TID) | INTRAVENOUS | Status: DC
  Filled 2016-03-08 (×3): qty 100

## 2016-03-08 MED ORDER — SODIUM CHLORIDE 0.9% FLUSH
3.0000 mL | Freq: Two times a day (BID) | INTRAVENOUS | Status: DC
  Administered 2016-03-08: 3 mL via INTRAVENOUS

## 2016-03-08 MED ORDER — SODIUM CHLORIDE FLUSH 0.9 % IV SOLN
INTRAVENOUS | Status: AC
  Filled 2016-03-08: qty 10

## 2016-03-08 MED ORDER — SUCCINYLCHOLINE CHLORIDE 20 MG/ML IJ SOLN
INTRAMUSCULAR | Status: DC | PRN
  Administered 2016-03-08: 80 mg via INTRAVENOUS

## 2016-03-08 MED ORDER — ONDANSETRON HCL 4 MG/2ML IJ SOLN
INTRAMUSCULAR | Status: DC | PRN
  Administered 2016-03-08: 4 mg via INTRAVENOUS

## 2016-03-08 MED ORDER — ADULT MULTIVITAMIN W/MINERALS CH
1.0000 | ORAL_TABLET | Freq: Every day | ORAL | Status: DC
  Administered 2016-03-09: 1 via ORAL
  Filled 2016-03-08: qty 1

## 2016-03-08 MED ORDER — REMIFENTANIL HCL 1 MG IV SOLR
INTRAVENOUS | Status: AC
  Filled 2016-03-08: qty 1000

## 2016-03-08 MED ORDER — MORPHINE SULFATE (PF) 2 MG/ML IV SOLN
2.0000 mg | INTRAVENOUS | Status: DC | PRN
  Administered 2016-03-08: 2 mg via INTRAVENOUS
  Filled 2016-03-08: qty 1

## 2016-03-08 MED ORDER — PROPOFOL 500 MG/50ML IV EMUL
INTRAVENOUS | Status: DC | PRN
  Administered 2016-03-08: 75 ug/kg/min via INTRAVENOUS

## 2016-03-08 MED ORDER — SODIUM CHLORIDE 0.9% FLUSH: 3.0000 mL | INTRAVENOUS | Status: DC | PRN

## 2016-03-08 MED ORDER — FENTANYL CITRATE (PF) 100 MCG/2ML IJ SOLN
INTRAMUSCULAR | Status: DC | PRN
  Administered 2016-03-08: 50 ug via INTRAVENOUS
  Administered 2016-03-08 (×2): 25 ug via INTRAVENOUS
  Administered 2016-03-08: 50 ug via INTRAVENOUS
  Administered 2016-03-08: 100 ug via INTRAVENOUS

## 2016-03-08 MED ORDER — REMIFENTANIL HCL 1 MG IV SOLR
INTRAVENOUS | Status: AC
  Filled 2016-03-08: qty 2000

## 2016-03-08 MED ORDER — CALCIUM CARBONATE-VITAMIN D 500-200 MG-UNIT PO TABS
1.0000 | ORAL_TABLET | Freq: Every day | ORAL | Status: DC
  Administered 2016-03-09: 10:00:00 1 via ORAL
  Filled 2016-03-08 (×2): qty 1

## 2016-03-08 MED ORDER — FENTANYL CITRATE (PF) 100 MCG/2ML IJ SOLN
INTRAMUSCULAR | Status: AC
  Administered 2016-03-08: 25 ug via INTRAVENOUS
  Filled 2016-03-08: qty 2

## 2016-03-08 MED ORDER — ACETAMINOPHEN 650 MG RE SUPP
650.0000 mg | Freq: Four times a day (QID) | RECTAL | Status: DC | PRN
Start: 2016-03-08 — End: 2016-03-09

## 2016-03-08 MED ORDER — HYDROMORPHONE HCL 1 MG/ML IJ SOLN
0.5000 mg | INTRAMUSCULAR | Status: AC | PRN
  Administered 2016-03-08 (×4): 0.5 mg via INTRAVENOUS

## 2016-03-08 MED ORDER — MIDAZOLAM HCL 2 MG/2ML IJ SOLN
INTRAMUSCULAR | Status: DC | PRN
  Administered 2016-03-08: 2 mg via INTRAVENOUS

## 2016-03-08 MED ORDER — FENTANYL CITRATE (PF) 100 MCG/2ML IJ SOLN
25.0000 ug | INTRAMUSCULAR | Status: DC | PRN
  Administered 2016-03-08 (×4): 25 ug via INTRAVENOUS

## 2016-03-08 MED ORDER — ACETAMINOPHEN 650 MG RE SUPP: 650.0000 mg | RECTAL | Status: DC | PRN

## 2016-03-08 MED ORDER — BACITRACIN 50000 UNITS IM SOLR
INTRAMUSCULAR | Status: AC
  Filled 2016-03-08: qty 1

## 2016-03-08 MED ORDER — PHENOL 1.4 % MT LIQD
1.0000 | OROMUCOSAL | Status: DC | PRN
  Filled 2016-03-08 (×2): qty 177

## 2016-03-08 MED ORDER — SENNA 8.6 MG PO TABS
1.0000 | ORAL_TABLET | Freq: Two times a day (BID) | ORAL | Status: DC
  Administered 2016-03-09: 8.6 mg via ORAL
  Filled 2016-03-08: qty 1

## 2016-03-08 MED ORDER — THROMBIN 5000 UNITS EX SOLR
CUTANEOUS | Status: AC
  Filled 2016-03-08: qty 5000

## 2016-03-08 MED ORDER — PHENYLEPHRINE HCL 10 MG/ML IJ SOLN
INTRAMUSCULAR | Status: DC | PRN
  Administered 2016-03-08: 200 ug via INTRAVENOUS
  Administered 2016-03-08: 100 ug via INTRAVENOUS
  Administered 2016-03-08: 200 ug via INTRAVENOUS

## 2016-03-08 MED ORDER — PROPOFOL 500 MG/50ML IV EMUL
INTRAVENOUS | Status: AC
  Filled 2016-03-08: qty 100

## 2016-03-08 MED ORDER — METOPROLOL TARTRATE 5 MG/5ML IV SOLN
INTRAVENOUS | Status: DC | PRN
  Administered 2016-03-08: 1 mg via INTRAVENOUS
  Administered 2016-03-08: 2 mg via INTRAVENOUS

## 2016-03-08 MED ORDER — SCOPOLAMINE 1 MG/3DAYS TD PT72
1.0000 | MEDICATED_PATCH | TRANSDERMAL | Status: DC
  Administered 2016-03-08: 1.5 mg via TRANSDERMAL
  Filled 2016-03-08: qty 1

## 2016-03-08 MED ORDER — FENTANYL CITRATE (PF) 250 MCG/5ML IJ SOLN
INTRAMUSCULAR | Status: AC
  Filled 2016-03-08: qty 5

## 2016-03-08 MED ORDER — FAMOTIDINE 20 MG PO TABS
20.0000 mg | ORAL_TABLET | Freq: Once | ORAL | Status: AC
  Administered 2016-03-08: 20 mg via ORAL

## 2016-03-08 MED ORDER — LIDOCAINE-EPINEPHRINE 1 %-1:100000 IJ SOLN
INTRAMUSCULAR | Status: AC
  Filled 2016-03-08: qty 1

## 2016-03-08 MED ORDER — PROMETHAZINE HCL 25 MG/ML IJ SOLN: 6.2500 mg | INTRAMUSCULAR | Status: DC | PRN

## 2016-03-08 MED ORDER — ACETAMINOPHEN 10 MG/ML IV SOLN
INTRAVENOUS | Status: AC
  Filled 2016-03-08: qty 100

## 2016-03-08 MED ORDER — LIDOCAINE-EPINEPHRINE 1 %-1:100000 IJ SOLN
INTRAMUSCULAR | Status: DC | PRN
Start: 2016-03-08 — End: 2016-03-08
  Administered 2016-03-08: 10 mL

## 2016-03-08 MED ORDER — METOCLOPRAMIDE HCL 5 MG/ML IJ SOLN
5.0000 mg | Freq: Once | INTRAMUSCULAR | Status: AC
  Administered 2016-03-08: 5 mg via INTRAVENOUS
  Filled 2016-03-08: qty 2

## 2016-03-08 MED FILL — Thrombin For Soln 5000 Unit: CUTANEOUS | Qty: 5000 | Status: AC

## 2016-03-08 SURGICAL SUPPLY — 68 items
3.0 MM TAPERED DIAMOND ROUND ×2 IMPLANT
4-0 MONOCRYL ×2 IMPLANT
BAND RUBBER 3X1/6 TAN STRL (MISCELLANEOUS) ×3 IMPLANT
BIT DRILL 13 (BIT) ×2 IMPLANT
BIT DRILL 13MM (BIT) ×1
BLADE BOVIE TIP EXT 4 (BLADE) ×3 IMPLANT
BLADE SURG 15 STRL LF DISP TIS (BLADE) ×1 IMPLANT
BLADE SURG 15 STRL SS (BLADE) ×2
BONE WEDGE CONERSTONE 5X14X11 (Bone Implant) ×2 IMPLANT
BONE WEDGE CONERSTONE 6X14X11 (Bone Implant) ×6 IMPLANT
BUR DIAMOND (BURR) ×3 IMPLANT
BUR NEURO DRILL SOFT 3.0X3.8M (BURR) ×3 IMPLANT
CANISTER SUCT 1200ML W/VALVE (MISCELLANEOUS) ×6 IMPLANT
CHLORAPREP W/TINT 26ML (MISCELLANEOUS) ×6 IMPLANT
COUNTER NEEDLE 20/40 LG (NEEDLE) ×3 IMPLANT
COVER LIGHT HANDLE STERIS (MISCELLANEOUS) ×6 IMPLANT
CRADLE LAMINECT ARM (MISCELLANEOUS) ×3 IMPLANT
CUP MEDICINE 2OZ PLAST GRAD ST (MISCELLANEOUS) ×6 IMPLANT
DERMABOND ADVANCED (GAUZE/BANDAGES/DRESSINGS) ×2
DERMABOND ADVANCED .7 DNX12 (GAUZE/BANDAGES/DRESSINGS) ×1 IMPLANT
DRAPE C-ARM 42X72 X-RAY (DRAPES) ×3 IMPLANT
DRAPE INCISE IOBAN 66X45 STRL (DRAPES) ×3 IMPLANT
DRAPE MICROSCOPE SPINE 48X150 (DRAPES) ×3 IMPLANT
DRAPE POUCH INSTRU U-SHP 10X18 (DRAPES) ×3 IMPLANT
DRAPE SHEET LG 3/4 BI-LAMINATE (DRAPES) ×9 IMPLANT
DRAPE SURG 17X11 SM STRL (DRAPES) ×12 IMPLANT
DRAPE TABLE BACK 80X90 (DRAPES) ×3 IMPLANT
DRAPE THYROID T SHEET (DRAPES) ×3 IMPLANT
ELECT CAUTERY BLADE TIP 2.5 (TIP) ×3
ELECT EZSTD 165MM 6.5IN (MISCELLANEOUS) ×3
ELECTRODE CAUTERY BLDE TIP 2.5 (TIP) ×1 IMPLANT
ELECTRODE EZSTD 165MM 6.5IN (MISCELLANEOUS) ×1 IMPLANT
FEE INTRAOP MONITOR IMPULS NCS (MISCELLANEOUS) ×1 IMPLANT
GLOVE BIOGEL PI IND STRL 8 (GLOVE) ×1 IMPLANT
GLOVE BIOGEL PI INDICATOR 8 (GLOVE) ×2
GLOVE SURG SYN 8.0 (GLOVE) ×6 IMPLANT
GOWN STRL REUS W/ TWL LRG LVL3 (GOWN DISPOSABLE) ×1 IMPLANT
GOWN STRL REUS W/ TWL XL LVL3 (GOWN DISPOSABLE) ×1 IMPLANT
GOWN STRL REUS W/TWL LRG LVL3 (GOWN DISPOSABLE) ×2
GOWN STRL REUS W/TWL XL LVL3 (GOWN DISPOSABLE) ×2
GRADUATE 1200CC STRL 31836 (MISCELLANEOUS) ×3 IMPLANT
INTRAOP MONITOR FEE IMPULS NCS (MISCELLANEOUS) ×1
INTRAOP MONITOR FEE IMPULSE (MISCELLANEOUS) ×2
KIT RM TURNOVER STRD PROC AR (KITS) ×3 IMPLANT
MARKER SKIN DUAL TIP RULER LAB (MISCELLANEOUS) ×3 IMPLANT
NEEDLE HYPO 22GX1.5 SAFETY (NEEDLE) ×3 IMPLANT
NEEDLE SPNL 22GX3.5 QUINCKE BK (NEEDLE) ×3 IMPLANT
NS IRRIG 1000ML POUR BTL (IV SOLUTION) ×3 IMPLANT
PACK LAMINECTOMY NEURO (CUSTOM PROCEDURE TRAY) ×3 IMPLANT
PIN CASPAR 14 (PIN) ×1 IMPLANT
PIN CASPAR 14MM (PIN) ×2
PLATE ZEVO 3LVL 57MM (Plate) ×2 IMPLANT
PUTTY GRAFTON 1CC (Putty) ×3 IMPLANT
ROUND DIAMOND BUR 3.0MM IMPLANT
SCREW 3.5 SELFTAP 15MM VARI (Screw) ×21 IMPLANT
SCREW 4.0 SELFTAP 15MM VARI (Screw) ×2 IMPLANT
SPOGE SURGIFLO 8M (HEMOSTASIS) ×4
SPONGE KITTNER 5P (MISCELLANEOUS) ×3 IMPLANT
SPONGE SURGIFLO 8M (HEMOSTASIS) ×2 IMPLANT
SPONGE XRAY 4X4 16PLY STRL (MISCELLANEOUS) ×3 IMPLANT
SUT VICRYL 2-0 SH 8X27 (SUTURE) ×3 IMPLANT
SUT VICRYL 3-0 CR8 SH (SUTURE) ×3 IMPLANT
SYR 30ML LL (SYRINGE) ×3 IMPLANT
TAPE ADH 3 LX (MISCELLANEOUS) ×3 IMPLANT
TOWEL OR 17X26 4PK STRL BLUE (TOWEL DISPOSABLE) ×6 IMPLANT
TRAY FOLEY W/METER SILVER 16FR (SET/KITS/TRAYS/PACK) ×3 IMPLANT
TUBING CONNECTING 10 (TUBING) ×2 IMPLANT
TUBING CONNECTING 10' (TUBING) ×1

## 2016-03-08 NOTE — Anesthesia Preprocedure Evaluation (Signed)
Anesthesia Evaluation  Patient identified by MRN, date of birth, ID band Patient awake    Reviewed: Allergy & Precautions, H&P , NPO status , Patient's Chart, lab work & pertinent test results, reviewed documented beta blocker date and time   History of Anesthesia Complications (+) PONV and history of anesthetic complications  Airway Mallampati: III  TM Distance: >3 FB Neck ROM: full    Dental  (+) Teeth Intact   Pulmonary neg pulmonary ROS,           Cardiovascular Exercise Tolerance: Good negative cardio ROS       Neuro/Psych  Headaches, neg Seizures H/o pituitary adenoma s/p treatment with bromocriptine.  Left arm numbness, but good neck motion. negative psych ROS   GI/Hepatic negative GI ROS, Neg liver ROS,   Endo/Other  negative endocrine ROS  Renal/GU negative Renal ROS  negative genitourinary   Musculoskeletal   Abdominal   Peds  Hematology negative hematology ROS (+)   Anesthesia Other Findings Past Medical History: No date: Cortical cataract No date: DDD (degenerative disc disease), cervical No date: DDD (degenerative disc disease), cervical No date: Diverticulitis No date: Hyperlipemia No date: Migraines     Comment: history of migraines No date: Pituitary adenoma ([place]) No date: PONV (postoperative nausea and vomiting) No date: Vitamin D deficiency   Reproductive/Obstetrics negative OB ROS                             Anesthesia Physical Anesthesia Plan  ASA: II  Anesthesia Plan: General   Post-op Pain Management:    Induction:   Airway Management Planned:   Additional Equipment:   Intra-op Plan:   Post-operative Plan:   Informed Consent: I have reviewed the patients History and Physical, chart, labs and discussed the procedure including the risks, benefits and alternatives for the proposed anesthesia with the patient or authorized representative who has  indicated his/her understanding and acceptance.   Dental Advisory Given  Plan Discussed with: Anesthesiologist, CRNA and Surgeon  Anesthesia Plan Comments:         Anesthesia Quick Evaluation

## 2016-03-08 NOTE — Brief Op Note (Signed)
03/08/2016  11:17 AM  PATIENT:  Morgan Lam  61 y.o. female  PRE-OPERATIVE DIAGNOSIS:  CERVICAL MYELOPATHY  POST-OPERATIVE DIAGNOSIS:  CERVICAL MYELOPATHY  PROCEDURE:  Procedure(s): ANTERIOR CERVICAL DECOMPRESSION/DISCECTOMY FUSION 3 LEVELS (Left)  SURGEON:  Surgeon(s) and Role:    * Deetta Perla, MD - Primary  PHYSICIAN ASSISTANT: Don    ANESTHESIA:   general  EBL:  Total I/O In: 1000 [I.V.:1000] Out: 250 [Urine:200; Blood:50]  BLOOD ADMINISTERED:none  DRAINS: none   LOCAL MEDICATIONS USED:  MARCAINE     SPECIMEN:  No Specimen  DISPOSITION OF SPECIMEN:  N/A  COUNTS:  YES  DICTATION: .Note written in paper chart and Note written in EPIC  PLAN OF CARE: Admit for overnight observation  PATIENT DISPOSITION:  PACU - hemodynamically stable.   Delay start of Pharmacological VTE agent (>24hrs) due to surgical blood loss or risk of bleeding: yes

## 2016-03-08 NOTE — Anesthesia Procedure Notes (Signed)
Procedure Name: Intubation Date/Time: 03/08/2016 7:43 AM Performed by: Alda Berthold Pre-anesthesia Checklist: Patient identified, Patient being monitored, Timeout performed, Emergency Drugs available and Suction available Patient Re-evaluated:Patient Re-evaluated prior to inductionOxygen Delivery Method: Circle system utilized Preoxygenation: Pre-oxygenation with 100% oxygen Intubation Type: IV induction Ventilation: Mask ventilation without difficulty Laryngoscope Size: Mac and 3 Grade View: Grade I Tube type: Oral Tube size: 6.0 mm Number of attempts: 1 Placement Confirmation: ETT inserted through vocal cords under direct vision,  positive ETCO2 and breath sounds checked- equal and bilateral Secured at: 21 cm Tube secured with: Tape Dental Injury: Teeth and Oropharynx as per pre-operative assessment

## 2016-03-08 NOTE — Anesthesia Post-op Follow-up Note (Cosign Needed)
Anesthesia QCDR form completed.        

## 2016-03-08 NOTE — Addendum Note (Signed)
Addendum  created 03/08/16 1340 by Alda Berthold, CRNA   Anesthesia Intra Flowsheets edited

## 2016-03-08 NOTE — H&P (Signed)
Morgan Lam is an 61 y.o. female.   Chief Complaint: Hand numbness HPI: Morgan Lam is here for evaluation of ongoing hand numbness and difficulty with fine motor movements and gait.  She states that she started experiencing these sensations last fall with numbness in her left hand which progressed all the fingers moving up into the arm.  She does have some pain in the hands with the numbness but denies any radiating pain down the arms.  At this time she also noticed she started to have some falls and feels that she is off balance.  She does note some spasms that occur in the left forearm and sometimes in the left leg.  She is right-handed but still notices difficulty with doing fine movements including writing, using buttons.  She is currently not on any pain medications other than over-the-counter medications.  She does take an 81 mg aspirin  She feels that the right arm numbness is worsening today  Past Medical History:  Diagnosis Date  . Cortical cataract   . DDD (degenerative disc disease), cervical   . DDD (degenerative disc disease), cervical   . Diverticulitis   . Hyperlipemia   . Migraines    history of migraines  . Pituitary adenoma ([place])   . PONV (postoperative nausea and vomiting)   . Vitamin D deficiency     Past Surgical History:  Procedure Laterality Date  . CHOLECYSTECTOMY    . COLONOSCOPY WITH PROPOFOL N/A 12/04/2014   Procedure: COLONOSCOPY WITH PROPOFOL;  Surgeon: Manya Silvas, MD;  Location: [facility] ENDOSCOPY;  Service: Endoscopy;  Laterality: N/A;  . Degenerative Disc    . EYE SURGERY     cataract os  . TUBAL LIGATION      Family History  Problem Relation Age of Onset  . Diabetes Maternal Uncle   . Stomach cancer Maternal Uncle   . Cancer Neg Hx    Social History:  reports that she has never smoked. She has never used smokeless tobacco. She reports that she drinks alcohol. She reports that she does not use drugs.  Allergies:  Allergies  Allergen  Reactions  . Acetaminophen-Codeine Nausea Only    Tylenol #3  . Codeine Nausea Only  . Ivp Dye [Iodinated Diagnostic Agents] Hives    Topical betadine pt is not allergic to.    Medications Prior to Admission  Medication Sig Dispense Refill  . aspirin 81 MG chewable tablet Chew 81 mg by mouth daily.    . Calcium Carb-Cholecalciferol (CALTRATE 600+D) 600-800 MG-UNIT TABS Take 1 tablet by mouth daily.    . Multiple Vitamin (MULTIVITAMIN WITH MINERALS) TABS tablet Take 1 tablet by mouth daily. Centrum silver      Results for orders placed or performed during the hospital encounter of 03/08/16 (from the past 48 hour(s))  I-STAT 4, (NA,K, GLUC, HGB,HCT)     Status: Abnormal   Collection Time: 03/08/16  6:49 AM  Result Value Ref Range   Sodium 143 135 - 145 mmol/L   Potassium 3.4 (L) 3.5 - 5.1 mmol/L   Glucose, Bld 106 (H) 65 - 99 mg/dL   HCT 37.0 36.0 - 46.0 %   Hemoglobin 12.6 12.0 - 15.0 g/dL   No results found.  ROS Review of Systems - General ROS: Negative Psychological ROS: Negative Ophthalmic ROS: Negative ENT ROS: Negative Hematological and Lymphatic ROS: Negative  Endocrine ROS: Negative Respiratory ROS: Negative Cardiovascular ROS: Negative Gastrointestinal ROS: Negative Genito-Urinary ROS: Negative Musculoskeletal ROS: Negative for neck pain  Neurological ROS: Positive for hand and arm numbness Dermatological ROS: Negative   Blood pressure (!) 163/86, pulse 83, temperature 98.1 F (36.7 C), temperature source Oral, resp. rate 16, SpO2 100 %. Physical Exam  Head: Normocephalic, atraumatic Eyes: Normal, EOM intact Oropharynx: Moist without lesions Neck: Supple, no tenderness to palpation Heart: Normal, regular rate and rhythm Lungs: Clear to auscultation, good air exchange Abdomen: Soft, nondistended Ext: No edema in LE bilaterally, good distal pulses  Neurologic exam:  Mental status: alertness: alert, affect: normal Speech: fluent and clear Motor: Strength  is 5 out of 5 in bilateral upper extremities including grip Sensory: Sensation is intact to light touch except decreased over the fingers Reflexes: 3+ reflexes at bilateral biceps, brachioradialis, patella, positive Hoffman's sign, negative for clonus at the ankles Gait: Broad-based gait  Imaging: MRI cervical spine: There is a normal lordosis with overall normal alignment.  There are disc bulges at C3-4 and C4-5 which results in severe stenosis and C5-6 which results in moderate stenosis.  There is no foraminal stenosis at these levels also.  There is some buckling of the posterior ligament at C4-5 which also contributes to the stenosis.  There is some T2 signal within the spinal cord at these levels  Assessment/Plan Morgan Lam is here for worsening cervical myelopathy. Plan is to proceed with C3-6 ACDF for relief of symptoms. Plan to admit post op for observation  Deetta Perla, MD 03/08/2016, 6:55 AM

## 2016-03-08 NOTE — Anesthesia Postprocedure Evaluation (Signed)
Anesthesia Post Note  Patient: Morgan Lam  Procedure(s) Performed: Procedure(s) (LRB): ANTERIOR CERVICAL DECOMPRESSION/DISCECTOMY FUSION 3 LEVELS (Left)  Patient location during evaluation: PACU Anesthesia Type: General Level of consciousness: awake and alert Pain management: pain level controlled Vital Signs Assessment: post-procedure vital signs reviewed and stable Respiratory status: spontaneous breathing, nonlabored ventilation, respiratory function stable and patient connected to nasal cannula oxygen Cardiovascular status: blood pressure returned to baseline and stable Postop Assessment: no signs of nausea or vomiting Anesthetic complications: no     Last Vitals:  Vitals:   03/08/16 1217 03/08/16 1221  BP:  (!) 160/91  Pulse: 75 73  Resp: 18 12  Temp:  36.7 C    Last Pain:  Vitals:   03/08/16 1221  TempSrc:   PainSc: 4                  Martha Clan

## 2016-03-08 NOTE — Transfer of Care (Addendum)
Immediate Anesthesia Transfer of Care Note  Patient: Morgan Lam  Procedure(s) Performed: Procedure(s): ANTERIOR CERVICAL DECOMPRESSION/DISCECTOMY FUSION 3 LEVELS (Left)  Patient Location: PACU  Anesthesia Type:General  Level of Consciousness: awake, alert , oriented and patient cooperative  Airway & Oxygen Therapy: Patient Spontanous Breathing and Patient connected to nasal cannula oxygen  Post-op Assessment: Report given to RN, Post -op Vital signs reviewed and stable and Patient moving all extremities  Post vital signs: Reviewed and stable  Last Vitals:  Vitals:   03/08/16 0607  BP: (!) 163/86  Pulse: 83  Resp: 16  Temp: 36.7 C    Last Pain:  Vitals:   03/08/16 0607  TempSrc: Oral         Complications: No apparent anesthesia complications

## 2016-03-08 NOTE — Op Note (Signed)
03/08/2016  11:17 AM  PATIENT:  Morgan Lam  61 y.o. female  PRE-OPERATIVE DIAGNOSIS:  CERVICAL MYELOPATHY (m50.00)  POST-OPERATIVE DIAGNOSIS:  CERVICAL MYELOPATHY (m50.00)  PROCEDURE:  Procedure(s): ANTERIOR CERVICAL DECOMPRESSION/DISCECTOMY FUSION 3 LEVELS (left) C3/4 Discectomy and Cadaveric Graft Placement C4/5 Discectomy and Cadaveric Graft Placement C5/6 Discectomy and Cadaveric Graft Placement Anterior Hardware (4 levels C3-6)  SURGEON:  Surgeon(s) and Role:    * Deetta Perla, MD - Primary  PHYSICIAN ASSISTANT: Liliane Bade, PA  ANESTHESIA:  GETA  EBL:  50 cc  SPECIMEN:  No Specimen   Indications Ms. Seville presented to our clinic on 2/13 with worsening left arm numbness and difficulty with fine motor movements inhibiiting her ability to perform daily activities. She had a MRI that showed disc herniations and osteophytes causing stenosis C3/4, C4/5, and C5/6 with some cord signal change showing edema. Given the degree of stenosis and worsening symptoms, we recommended an anterior cervical decompression and fusion to relieve the pressure on the spinal cord. The risks of hematoma, infection, poor bone healing and failure of fusion, cord injury, weakness, numbness, neck pain, stroke, and death were discussed in detail. All questions were answered and the patient elected to proceed with the surgery.   Procedure After obtaining informed consent, the patient was taken to the Operating Room where general anesthesia was induced and the patient intubated. Vascular access was obtained. Decadron was administered. A foley catheter was placed. Neuromonitoring electrodes were placed prior to positioning and SSEPs and MEPs in proximal arm were strong with weak MEPs in hands and lower extremities. The head was slightly extended and imaging used to identify a skin crease overlying the C4 vertebral body.   The patient was prepped and draped in the usual sterile fashion and a  timeout was performed per protocol. Local anesthesia was instilled with epinephrine along the planned incision site. A transverse cervicalincision was performed on the left in a skin crease. The incision was carried to the level of the platysma and then cautery was used to incise the muscle. Blunt dissection was used to expand the plane and the dissection was carried deep medial to the SCM and carotid sheath being careful to identify the trachea and esophagus medially. The prevertebral fascia was identified and this was bluntly dissected to expose the disc spaces. A needle was placed in the disc space and x-ray confirmed the C3-4 disc level.   Next, cautery was used to undermine the longus colli muscles bilaterally and identify the C3/4 disc space. Caspar pins were placed at C3 and C4 and a retractor system placed under the muscles to complete the exposure. Next, a combination of curettes and Kerrison rongeurs were used to remove the anterior osteophyte at C3/4 and then the disc material. A 66mm matchstick was used to shave the endplates of the adjacent bodies. A trial spacer was used to size the graft and then hemostasis obtained. The microscope was brought into the field for the remainder of the surgery. The 48mm diamond drill was used to remove the osteophyte/disc complex deep to the level of the PLL at C3/4.  There was significant disc protrusion at this level that was removed with rongeurs. The PLL was entered with a hook and then the PLL was removed along with remaining disc material to decompress centrally and then out into bilateral neuro foramen. The ligament was very adherent to the dura at this level and was dissected free with a small residual portion left. A blunt probe was used  to confirm no residual stenosis laterally and a curette used to ensure no posterior osteophyte remained. A combination of Floseal and gelfoam was used for hemostasis. Allograft was placed, 23mm in height and placed slightly  recessed to the anterior edge of vertebral body  The caspar pin was moved from C3 to C5 and a retractor system placed under the muscles to complete the exposure. Next, a combination of curettes and Kerrison rongeurs were used to remove the anterior osteophyte at C4/5 and then the disc material. A 52mm matchstick was used to shave the endplates of the adjacent bodies. A trial spacer was used to size the graft and then hemostasis obtained. The 32mm diamond drill was used to remove the osteophyte/disc complex deep to the level of the PLL at C4/5. There was significant disc protrusion at this level that was removed with rongeurs. The PLL was entered with a hook and then the PLL was removed along with remaining disc material to decompress centrally and then out into bilateral neuro foramen. A blunt probe was used to confirm no residual stenosis laterally and a curette used to ensure no posterior osteophyte remained. A combination of Floseal and gelfoam was used for hemostasis. Allograft was placed, 60mm in height and placed slightly recessed to the anterior edge of vertebral body  The caspar pin was moved from C4 to C6 and a retractor system placed under the muscles to complete the exposure. Next, a combination of curettes and Kerrison rongeurs were used to remove the anterior osteophyte at C5/6 and then the disc material. A 60mm matchstick was used to shave the endplates of the adjacent bodies. A trial spacer was used to size the graft and then hemostasis obtained. The 29mm diamond drill was used to remove the osteophyte/disc complex deep to the level of the PLL at C5/6. There was minimal disc at this level due t collapsed disc space, however, the osteophytes were prominent anteriorly and posteriorly. The PLL was entered with a hook and then the PLL was removed along with remaining disc material to decompress centrally and then out into bilateral neuro foramen. A blunt probe was used to confirm no residual stenosis  laterally and a curette used to ensure no posterior osteophyte remained. A combination of Floseal and gelfoam was used for hemostasis. Allograft was placed, 36mm in height and placed slightly recessed to the anterior edge of vertebral body  X-ray confirmed good placement of grafts.  The caspar pins were removed and bone wax placed. The remainder of the osteophytes were drilled to allow for plating. Next, a 57 mm plate was found to be the adequate size, was bent to curve of spine,  and was placed in the midline and secured with two 72mm screws at each bone level. Xray was obtained confirming good graft placement and adequate depth of screws. The retractors were removed. The wound was irrigated copiously and hemostasis obtained. The platysma was closed with 2-0 vicryl suture. The dermis was closed with 3-0 Vicryl and Dermabond was placed on the skin.  The patient had general anesthesia reversed and was extubated following the procedure. She awoke following commands with symmetric movement. She was taken to the PACU where recovery continued and then the ward.    IMPLANTS Medtronic screws, 33mm length x 8 Medtronic anterior plate, 67mm length, 3 level plate Bone graft with BMP 51mm height x 2 Bone graft with BMP 3mm height,x 1  I performed the case in its entirety.  Cathleen Fears, MD

## 2016-03-09 ENCOUNTER — Encounter: Payer: Self-pay | Admitting: Neurosurgery

## 2016-03-09 DIAGNOSIS — M5000 Cervical disc disorder with myelopathy, unspecified cervical region: Secondary | ICD-10-CM | POA: Diagnosis not present

## 2016-03-09 MED ORDER — METHOCARBAMOL 500 MG PO TABS: 500.0000 mg | ORAL_TABLET | Freq: Three times a day (TID) | ORAL | 1 refills | Status: DC | PRN

## 2016-03-09 NOTE — Progress Notes (Signed)
Pts BP 174/90. MD Lacinda Axon notified, per MD not neccessary to give IV Hydralize, pt ok to discharge. Pt needs to follow up with PCP within the next couple of days. Pt notified and verbalized understanding.

## 2016-03-09 NOTE — Discharge Instructions (Addendum)
NEUROSURGERY DISCHARGE INSTRUCTIONS  Admission Diagnosis:  Cervical Myelopathy  Discharge Diagnosis: Cervical Myelopathy  Operative procedure:  C3-6 Anterior Cervical Discectomy and Fusion  The following are instructions to help in your recovery once you have been discharged from the hospital. Even if you feel well, it is important that you follow these activity guidelines. If you do not let your neck heal properly from the surgery, you can increase the chance of return of your symptoms and other complications.   What to do after you leave the hospital:  Recommended diet:  Increase protein intake to promote wound healing. You may return to your usual diet. However, you may experience discomfort when swallowing in the first month after your surgery. This is normal. You may find that softer foods are more comfortable for you to swallow. Be sure to stay hydrated.   Recommended activity: No bending, lifting, or twisting (BLT). Avoid lifting objects heavier than 10 pounds (gallon milk jug). Where possible, avoid household activities that involve lifting, bending, reaching, pushing, or pulling such as laundry, vacuuming, grocery shopping, and childcare. Try to arrange for help from friends and family for these activities while your neck heals.   Increase physical activity slowly as tolerated. Taking short walks is encouraged, but avoid strenuous exercise. Do not jog, run, bicycle, lift weights, or participate in any other exercises unless specifically allowed by your doctor.   You should not drive until cleared by your doctor.   Until released by your doctor, you should not return to work or school. You should rest at home and let your body heal.   You may shower the day after your surgery. After showering, lightly dab your incision dry. Do not take a tub bath or go swimming until approved by your doctor at your follow-up appointment.   Continue wearing cervical collar until follow  up  Medications  Do not restart any Aspirin products until seven days after surgery  * Do not take anti-inflammatory medications for 3 months after surgery (naproxen [Aleve], ibuprofen [Advil, Motrin], celecoxib [Celebrex], etc.). These medications can prevent your bones from healing properly.   You may restart other home medications.   Special Instructions  No need to apply dressing . Keep your incision area clean and dry.   The steri-strips/glue should begin to peel away within about a week (it is fine if the steri-strips fall off before then). If the strips are still in place one week after your surgery, you may gently remove them.     Please Report any of the following: You may experience pain in your neck and/or pain between your shoulder blades. This is normal and should improve in the next few weeks with the help of pain medication, muscle relaxers, and rest. Some patients report that a warm compress on the back of the neck or between the shoulder blades helps.   However, should you experience any of the following, contact us immediately:   New numbness or weakness   Pain that is progressively getting worse, and is not relieved by your pain medication, muscle relaxers, rest, and warm compresses   Bleeding, redness, swelling, pain, or drainage from surgical incision   Chills or flu-like symptoms   Fever greater than 101.0 F (38.3 C)   Inability to eat, drink fluids, or take medications   Problems with bowel or bladder functions   Difficulty breathing or shortness of breath   Warmth, tenderness, or swelling in your calf    Additional Follow up appointments During  office hours (Monday-Friday 9 am to 5 pm), please call your physician at [phone]  After hours and weekends, please call the [facility] Operator at  [phone] and ask for the Neurosurgeon On Call   For a life-threatening emergency, call 911    Please see below for scheduled  appointments:  April 06, 2016 at 1:30pm

## 2016-03-09 NOTE — Progress Notes (Signed)
DISCHARGE NOTE:  Pt given discharge understanding and prescriptions. Pt verbalized understanding. Pt wheeled to car by staff.

## 2016-03-09 NOTE — Evaluation (Signed)
Occupational Therapy Evaluation Patient Details Name: Morgan Lam MRN: [id number] DOB: 03-18-1955 Today's Date: 03/09/2016    History of Present Illness Pt is a 61 yo female, s/p Anterior decompression/disectomy and fusion of C3/4, C4/5, C5/6 vertebrae.    Clinical Impression   Pt seen for OT evaluation this date. Pt presents at independent level for functional mobility, toileting, and modified independent for LB ADL tasks while maintaining cervical precautions. Pt would benefit from outpatient OT services to support cervical ROM and address BUE numbness and fine motor coordination. No further acute care OT needs at this time. Will sign off. Please re-consult if there is a change in pt status.    Follow Up Recommendations  Outpatient OT    Equipment Recommendations  Other (comment) (reacher, sock aid, LH sponge)    Recommendations for Other Services       Precautions / Restrictions Precautions Precautions: Cervical Restrictions Weight Bearing Restrictions: No      Mobility Bed Mobility Overal bed mobility: Independent             General bed mobility comments: maintained cervical precautions  Transfers Overall transfer level: Independent               General transfer comment: Able to transfer to standing w/o need of AD    Balance Overall balance assessment: History of Falls;No apparent balance deficits (not formally assessed)                                          ADL Overall ADL's : Modified independent                                       General ADL Comments: Pt able to perform all bed mobility, ambulation, toileting tasks, dressing at modified indep level with increased time and mindful of cervical precautions. Pt educated in use of AE for LB dressing tasks and pt able to return demonstrate safe technique while minimizing need for pulling     Vision Baseline Vision/History: Wears glasses Wears Glasses: At all  times Patient Visual Report: No change from baseline Vision Assessment?: No apparent visual deficits     Perception     Praxis Praxis Praxis tested?: Within functional limits    Pertinent Vitals/Pain Pain Assessment: 0-10 Pain Score: 8  Pain Location: Headache Pain Descriptors / Indicators: Aching Pain Intervention(s): Limited activity within patient's tolerance;Monitored during session;Premedicated before session     Hand Dominance     Extremity/Trunk Assessment Upper Extremity Assessment Upper Extremity Assessment: Overall WFL for tasks assessed (pt reports some numbness in BUE but no significant functional deficits)   Lower Extremity Assessment Lower Extremity Assessment: Defer to PT evaluation;Overall [facility] for tasks assessed   Cervical / Trunk Assessment Cervical / Trunk Assessment: Normal   Communication Communication Communication: No difficulties   Cognition Arousal/Alertness: Awake/alert Behavior During Therapy: WFL for tasks assessed/performed Overall Cognitive Status: Within Functional Limits for tasks assessed                     General Comments       Exercises   Other Exercises Other Exercises: pt educated in home/routines modifications to support falls prevention and maintain cervical precautions   Shoulder Instructions      Home Living Family/patient expects to be  discharged to:: Private residence Living Arrangements: Spouse/significant other Available Help at Discharge: Available 24 hours/day;Family Type of Home: House Home Access: Stairs to enter [employer] of Steps: 8 STE from back (L rail), 2 STE from front (no rail) Entrance Stairs-Rails: Left Home Layout: Multi-level;Able to live on main level with bedroom/bathroom     Bathroom Shower/Tub: Tub/shower unit Shower/tub characteristics: Architectural technologist: Standard Bathroom Accessibility: Yes How Accessible: Accessible via walker Home Equipment: Grab bars -  tub/shower;Hand held shower head          Prior Functioning/Environment Level of Independence: Independent        Comments: Able to perform ADLs and IADLs w/o need for assistive device; reports 2 falls in past 12 months due to LOB from "poor gait due to bad R hip"        OT Problem List:        OT Treatment/Interventions:      OT Goals(Current goals can be found in the care plan section) Acute Rehab OT Goals Patient Stated Goal: Return home  OT Goal Formulation: With patient Time For Goal Achievement: 03/16/16 Potential to Achieve Goals: Good  OT Frequency:     Barriers to D/C:            Co-evaluation              End of Session    Activity Tolerance: Patient tolerated treatment well Patient left: in bed;with call bell/phone within reach  OT Visit Diagnosis: History of falling (Z91.81);Pain Pain - part of body:  (headache)                ADL either performed or assessed with clinical judgement  Time: 1028-1053 OT Time Calculation (min): 25 min Charges:  OT General Charges $OT Visit: 1 Procedure OT Evaluation $OT Eval Low Complexity: 1 Procedure OT Treatments $Self Care/Home Management : 8-22 mins G-Codes: OT G-codes **NOT FOR INPATIENT CLASS** Functional Assessment Tool Used: AM-PAC 6 Clicks Daily Activity Functional Limitation: Self care Self Care Current Status [id number]): At least 20 percent but less than 40 percent impaired, limited or restricted Self Care Goal Status [id number]): At least 20 percent but less than 40 percent impaired, limited or restricted Self Care Discharge Status [phone]): At least 20 percent but less than 40 percent impaired, limited or restricted   Jeni Salles, MPH, MS, OTR/L ascom [phone] 03/09/16, 11:27 AM

## 2016-03-09 NOTE — Evaluation (Signed)
Physical Therapy Evaluation and Discharge Patient Details Name: Morgan Lam MRN: [id number] DOB: [birth year] Today's Date: 03/09/2016   History of Present Illness  Pt is a 61 yo female, s/p Anterior decompression/disectomy and fusion of C3/4, C4/5, C5/6 vertebrae.   Clinical Impression  Pt awake, alert and demonstrated good safety awareness and participation throughout PT eval. She complained of headache 8/10 pain and some nausea, nursing is aware. Briefly educated patient on cervical precautions and provided her with instruction packet describing precautions in further detail. Pt displayed good overall strength during functional tasks, and able to perform bed mobility, transfers and ambulation independently under PT supervision. Pt also ascended and descended 3 steps using a L handrail w/ min guarding for safety. Overall patient displays good mobility w/ neck impairments that were not assessed due to recent surgery and immobilization. She displays no current acute care PT needs and is able to perform basic mobility to return home, she will benefit from Outpatient PT to improve neck ROM and UE strength when appropriate.     Follow Up Recommendations Outpatient PT (per orthopedic preference)    Equipment Recommendations       Recommendations for Other Services       Precautions / Restrictions Precautions Precautions: Cervical Restrictions Weight Bearing Restrictions: No      Mobility  Bed Mobility Overal bed mobility: Independent             General bed mobility comments: Pt able to demonstrate good log roll and scooting to EOB  Transfers Overall transfer level: Independent               General transfer comment: Able to transfer to standing w/o need of AD  Ambulation/Gait Ambulation/Gait assistance: Supervision Ambulation Distance (Feet): 160 Feet Assistive device: None Gait Pattern/deviations: Step-through pattern   Gait velocity interpretation: at or above  normal speed for age/gender General Gait Details: Pt ambulated w/o AD, displayed good stability and normal gait speed w/o LOB, arms were tucked in close to chest due to feeling cold, able to speed up and slow down w/o staggering or LOB  Stairs Stairs: Yes Stairs assistance: Min guard Stair Management: One rail Left Number of Stairs: 3 General stair comments: safe ascending and descending steps, min cuing for use of hand rails, limited in stair ambulation due to IV pole,   Wheelchair Mobility    Modified Rankin (Stroke Patients Only)       Balance Overall balance assessment: No apparent balance deficits (not formally assessed)                                           Pertinent Vitals/Pain Pain Assessment: 0-10 Pain Score: 8  Pain Location: Headache Pain Descriptors / Indicators: Aching Pain Intervention(s): Monitored during session;Other (comment) (RN aware of headache pain)    Home Living Family/patient expects to be discharged to:: Private residence Living Arrangements: Spouse/significant other Available Help at Discharge: Available 24 hours/day Type of Home: House Home Access: Stairs to enter Entrance Stairs-Rails: Left Entrance Stairs-Number of Steps: 8 Home Layout: Multi-level;Able to live on main level with bedroom/bathroom Home Equipment: None      Prior Function Level of Independence: Independent         Comments: Able to perform ADLs and IADLs w/o need for assistive device      Hand Dominance        Extremity/Trunk Assessment  Upper Extremity Assessment Upper Extremity Assessment: Defer to OT evaluation (avoid pulling and pushing due to cervical surgery, overall good ROM no sings of impairments, neck immobilized)    Lower Extremity Assessment Lower Extremity Assessment: Overall WFL for tasks assessed       Communication   Communication: No difficulties  Cognition Arousal/Alertness: Awake/alert Behavior During Therapy: WFL  for tasks assessed/performed Overall Cognitive Status: Within Functional Limits for tasks assessed                      General Comments      Exercises     Assessment/Plan    PT Assessment All further PT needs can be met in the next venue of care (Pt to benefit from outpatient PT to improve neck ROM and strength when appropriate)  PT Problem List         PT Treatment Interventions      PT Goals (Current goals can be found in the Care Plan section)  Acute Rehab PT Goals Patient Stated Goal: Return home  PT Goal Formulation: With patient Time For Goal Achievement: 03/23/16 Potential to Achieve Goals: Good    Frequency     Barriers to discharge        Co-evaluation               End of Session Equipment Utilized During Treatment: Gait belt Activity Tolerance: Patient tolerated treatment well Patient left: in chair;with call bell/phone within reach;with family/visitor present Nurse Communication: Mobility status      Functional Assessment Tool Used: AM-PAC 6 Clicks Basic Mobility;Clinical judgement Functional Limitation: Mobility: Walking and moving around Mobility: Walking and Moving Around Current Status [phone]): At least 1 percent but less than 20 percent impaired, limited or restricted Mobility: Walking and Moving Around Goal Status [phone]): At least 1 percent but less than 20 percent impaired, limited or restricted    Time: 0847-0909 PT Time Calculation (min) (ACUTE ONLY): 22 min   Charges:   PT Evaluation $PT Eval Low Complexity: 1 Procedure     PT G Codes:   PT G-Codes **NOT FOR INPATIENT CLASS** Functional Assessment Tool Used: AM-PAC 6 Clicks Basic Mobility;Clinical judgement Functional Limitation: Mobility: Walking and moving around Mobility: Walking and Moving Around Current Status ([id number]): At least 1 percent but less than 20 percent impaired, limited or restricted Mobility: Walking and Moving Around Goal Status [phone]): At least 1  percent but less than 20 percent impaired, limited or restricted     Orlene Och, Student PT 03/09/2016, 10:08 AM   This entire session was performed under direct supervision and direction of a licensed therapist/therapist assistant . I have personally read, edited and approve of the note as written. Collie Siad PT, DPT

## 2016-03-10 NOTE — Discharge Summary (Signed)
Physician Discharge Summary  Patient ID: WILLEAN HORNBURG MRN: [id number] DOB/AGE: Mar 17, 1955 61 y.o.  Admit date: 03/08/2016 Discharge date: 03/09/2016  Admission Diagnoses: Cervical Myelopathy  Discharge Diagnoses:  Active Problems:   Cervical myelopathy [facility])   Discharged Condition: good  Hospital Course: Ms. Wannamaker was taken to the operating room on 2/26 for a C3-6 ACDF. There were no complications and the patient was taken to the PACU and then ward for post-operative care. She was seen to be at neurological baseline. She was having some neck pain and nausea post-op which was treated. She was seen to get up and ambulate. She tolerated liquids by mouth. She was placed in a cervical collar. On POD#1, she was ambulating with PT and cleared for home. She was able to tolerate food for breakfast. Her incision was healing well. She had no neurological changes. Her post op films had been completed. She was discharged home.   Consults: None  Significant Diagnostic Studies: radiology: X-Ray: Cervical Spine  Treatments: analgesia: acetaminophen  Discharge Exam: Blood pressure (!) 174/90, pulse 93, temperature 98.8 F (37.1 C), temperature source Oral, resp. rate 17, height 5\' 7"  (1.702 m), weight 56.7 kg (125 lb), SpO2 100 %. Neurologic:  5/5 strength in upper and lower extremities Some numbness in fingers of left hand (op baseline) Cervical collar on Neck soft and flat  Disposition: 01-Home or Self Care  Discharge Instructions    Diet - low sodium heart healthy    Complete by:  As directed    Incentive spirometry RT    Complete by:  As directed    Increase activity slowly    Complete by:  As directed      Allergies as of 03/09/2016      Reactions   Acetaminophen-codeine Nausea Only   Tylenol #3   Codeine Nausea Only   Ivp Dye [iodinated Diagnostic Agents] Hives   Topical betadine pt is not allergic to.      Medication List    TAKE these medications   aspirin 81 MG  chewable tablet Chew 81 mg by mouth daily.   CALTRATE 600+D 600-800 MG-UNIT Tabs Generic drug:  Calcium Carb-Cholecalciferol Take 1 tablet by mouth daily.   methocarbamol 500 MG tablet Commonly known as:  ROBAXIN Take 1 tablet (500 mg total) by mouth every 8 (eight) hours as needed for muscle spasms.   multivitamin with minerals Tabs tablet Take 1 tablet by mouth daily. Centrum silver        Signed: Deetta Perla 03/10/2016, 8:55 PM

## 2016-03-31 ENCOUNTER — Encounter
Admission: RE | Admit: 2016-03-31 | Discharge: 2016-03-31 | Disposition: A | Discharge status: Home / Self Care | Payer: BC Managed Care – PPO | Source: Ambulatory Visit | Attending: Orthopedic Surgery | Admitting: Orthopedic Surgery

## 2016-03-31 DIAGNOSIS — Z01812 Encounter for preprocedural laboratory examination: Secondary | ICD-10-CM | POA: Insufficient documentation

## 2016-03-31 LAB — TYPE AND SCREEN
ABO/RH(D): A POS
ANTIBODY SCREEN: NEGATIVE

## 2016-03-31 LAB — COMPREHENSIVE METABOLIC PANEL
ALBUMIN: 4 g/dL (ref 3.5–5.0)
ALT: 16 U/L (ref 14–54)
ANION GAP: 9 (ref 5–15)
AST: 26 U/L (ref 15–41)
Alkaline Phosphatase: 65 U/L (ref 38–126)
BILIRUBIN TOTAL: 0.8 mg/dL (ref 0.3–1.2)
BUN: 6 mg/dL (ref 6–20)
CHLORIDE: 105 mmol/L (ref 101–111)
CO2: 30 mmol/L (ref 22–32)
Calcium: 9.5 mg/dL (ref 8.9–10.3)
Creatinine, Ser: 0.72 mg/dL (ref 0.44–1.00)
GFR calc Af Amer: >60 mL/min (ref >60)
GFR calc non Af Amer: >60 mL/min (ref >60)
GLUCOSE: 135 mg/dL — AB (ref 65–99)
POTASSIUM: 3.8 mmol/L (ref 3.5–5.1)
SODIUM: 144 mmol/L (ref 135–145)
TOTAL PROTEIN: 6.9 g/dL (ref 6.5–8.1)

## 2016-03-31 LAB — URINALYSIS, ROUTINE W REFLEX MICROSCOPIC
Bilirubin Urine: NEGATIVE
Glucose, UA: NEGATIVE mg/dL
Hgb urine dipstick: NEGATIVE
KETONES UR: NEGATIVE mg/dL
Nitrite: NEGATIVE
PH: 6 (ref 5.0–8.0)
PROTEIN: NEGATIVE mg/dL
Specific Gravity, Urine: 1.018 (ref 1.005–1.030)

## 2016-03-31 LAB — CBC
HCT: 35.3 % (ref 35.0–47.0)
Hemoglobin: 11.6 g/dL — ABNORMAL LOW (ref 12.0–16.0)
MCH: 28.1 pg (ref 26.0–34.0)
MCHC: 32.8 g/dL (ref 32.0–36.0)
MCV: 85.8 fL (ref 80.0–100.0)
PLATELETS: 257 10*3/uL (ref 150–440)
RBC: 4.11 MIL/uL (ref 3.80–5.20)
RDW: 14.3 % (ref 11.5–14.5)
WBC: 8.1 10*3/uL (ref 3.6–11.0)

## 2016-03-31 LAB — SEDIMENTATION RATE: SED RATE: 9 mm/h (ref 0–30)

## 2016-03-31 LAB — PROTIME-INR
INR: 1.03
Prothrombin Time: 13.5 seconds (ref 11.4–15.2)

## 2016-03-31 LAB — C-REACTIVE PROTEIN: CRP: <0.8 mg/dL (ref <1.0)

## 2016-03-31 LAB — SURGICAL PCR SCREEN
MRSA, PCR: NEGATIVE
STAPHYLOCOCCUS AUREUS: NEGATIVE

## 2016-03-31 LAB — APTT: APTT: 32 s (ref 24–36)

## 2016-03-31 NOTE — Patient Instructions (Signed)
Your procedure is scheduled on: 04/12/16 Mon Report to Same Day Surgery 2nd floor medical mall [place] Entrance-take elevator on left to 2nd floor.  Check in with surgery information desk.) To find out your arrival time please call [phone] between 1PM - 3PM on 04/09/16 Fri  Remember: Instructions that are not followed completely may result in serious medical risk, up to and including death, or upon the discretion of your surgeon and anesthesiologist your surgery may need to be rescheduled.    _x___ 1. Do not eat food or drink liquids after midnight. No gum chewing or                              hard candies.     __x__ 2. No Alcohol for 24 hours before or after surgery.   __x__3. No Smoking for 24 prior to surgery.   ____  4. Bring all medications with you on the day of surgery if instructed.    __x__ 5. Notify your doctor if there is any change in your medical condition     (cold, fever, infections).     Do not wear jewelry, make-up, hairpins, clips or nail polish.  Do not wear lotions, powders, or perfumes. You may wear deodorant.  Do not shave 48 hours prior to surgery. Men may shave face and neck.  Do not bring valuables to the hospital.    [facility] is not responsible for any belongings or valuables.               Contacts, dentures or bridgework may not be worn into surgery.  Leave your suitcase in the car. After surgery it may be brought to your room.  For patients admitted to the hospital, discharge time is determined by your                       treatment team.   Patients discharged the day of surgery will not be allowed to drive home.  You will need someone to drive you home and stay with you the night of your procedure.    Please read over the following fact sheets that you were given:   [facility] Preparing for Surgery and or MRSA Information   _x___ Take anti-hypertensive (unless it includes a diuretic), cardiac, seizure, asthma,     anti-reflux and  psychiatric medicines. These include:  1. None  2.  3.  4.  5.  6.  ____Fleets enema or Magnesium Citrate as directed.   _x___ Use CHG Soap or sage wipes as directed on instruction sheet   ____ Use inhalers on the day of surgery and bring to hospital day of surgery  ____ Stop Metformin and Janumet 2 days prior to surgery.    ____ Take 1/2 of usual insulin dose the night before surgery and none on the morning     surgery.   _x___ Follow recommendations from Cardiologist, Pulmonologist or PCP regarding          stopping Aspirin, Coumadin, Pllavix ,Eliquis, Effient, or Pradaxa, and Pletal. Stopped Aspirin for last surgery and will not restart until after hip surgery.  X____Stop Anti-inflammatories such as Advil, Aleve, Ibuprofen, Motrin, Naproxen, Naprosyn, Goodies powders or aspirin products. OK to take Tylenol and                          Celebrex.   _x___  Stop supplements until after surgery.  But may continue Vitamin D, Vitamin B,       and multivitamin.   ____ Bring C-Pap to the hospital.

## 2016-04-01 LAB — URINE CULTURE: SPECIAL REQUESTS: NORMAL

## 2016-04-02 NOTE — Pre-Procedure Instructions (Signed)
Urine culture sent to Dr. Marry Guan for review.  Saw fax from Dr. Marry Guan that prescription sent to patient's pharmacy for UTI.

## 2016-04-11 MED ORDER — SODIUM CHLORIDE 0.9 % IV SOLN
1000.0000 mg | INTRAVENOUS | Status: DC
  Filled 2016-04-11: qty 10

## 2016-04-11 MED ORDER — CEFAZOLIN SODIUM-DEXTROSE 2-4 GM/100ML-% IV SOLN: 2.0000 g | INTRAVENOUS | Status: DC

## 2016-04-11 MED ORDER — FAMOTIDINE 20 MG PO TABS
20.0000 mg | ORAL_TABLET | Freq: Once | ORAL | Status: DC
  Administered 2016-04-12: 20 mg via ORAL

## 2016-04-12 ENCOUNTER — Inpatient Hospital Stay: Payer: BC Managed Care – PPO

## 2016-04-12 ENCOUNTER — Inpatient Hospital Stay
Admission: RE | Admit: 2016-04-12 | Discharge: 2016-04-14 | DRG: 470 | Disposition: A | Discharge status: Home Health | Payer: BC Managed Care – PPO | Source: Ambulatory Visit | Attending: Orthopedic Surgery | Admitting: Orthopedic Surgery

## 2016-04-12 ENCOUNTER — Inpatient Hospital Stay: Payer: BC Managed Care – PPO | Admitting: Anesthesiology

## 2016-04-12 ENCOUNTER — Encounter
Admission: RE | Disposition: A | Discharge status: Home Health | Payer: Self-pay | Source: Ambulatory Visit | Attending: Orthopedic Surgery

## 2016-04-12 ENCOUNTER — Encounter: Payer: Self-pay | Admitting: Orthopedic Surgery

## 2016-04-12 DIAGNOSIS — M1611 Unilateral primary osteoarthritis, right hip: Principal | ICD-10-CM | POA: Diagnosis present

## 2016-04-12 DIAGNOSIS — D62 Acute posthemorrhagic anemia: Secondary | ICD-10-CM | POA: Diagnosis not present

## 2016-04-12 DIAGNOSIS — R000 Tachycardia, unspecified: Secondary | ICD-10-CM | POA: Diagnosis present

## 2016-04-12 DIAGNOSIS — Z79899 Other long term (current) drug therapy: Secondary | ICD-10-CM | POA: Diagnosis not present

## 2016-04-12 DIAGNOSIS — E876 Hypokalemia: Secondary | ICD-10-CM | POA: Diagnosis not present

## 2016-04-12 DIAGNOSIS — E785 Hyperlipidemia, unspecified: Secondary | ICD-10-CM | POA: Diagnosis present

## 2016-04-12 DIAGNOSIS — Z888 Allergy status to other drugs, medicaments and biological substances status: Secondary | ICD-10-CM | POA: Diagnosis not present

## 2016-04-12 DIAGNOSIS — D352 Benign neoplasm of pituitary gland: Secondary | ICD-10-CM | POA: Diagnosis present

## 2016-04-12 DIAGNOSIS — Z91041 Radiographic dye allergy status: Secondary | ICD-10-CM | POA: Diagnosis not present

## 2016-04-12 DIAGNOSIS — F419 Anxiety disorder, unspecified: Secondary | ICD-10-CM | POA: Diagnosis present

## 2016-04-12 DIAGNOSIS — Z7982 Long term (current) use of aspirin: Secondary | ICD-10-CM

## 2016-04-12 DIAGNOSIS — E559 Vitamin D deficiency, unspecified: Secondary | ICD-10-CM | POA: Diagnosis present

## 2016-04-12 DIAGNOSIS — Z96649 Presence of unspecified artificial hip joint: Secondary | ICD-10-CM

## 2016-04-12 HISTORY — PX: TOTAL HIP ARTHROPLASTY: SHX124

## 2016-04-12 SURGERY — ARTHROPLASTY, HIP, TOTAL,POSTERIOR APPROACH
Anesthesia: Spinal | Site: Hip | Laterality: Right | Wound class: Clean

## 2016-04-12 MED ORDER — PANTOPRAZOLE SODIUM 40 MG PO TBEC
40.0000 mg | DELAYED_RELEASE_TABLET | Freq: Two times a day (BID) | ORAL | Status: DC
  Administered 2016-04-12 – 2016-04-14 (×3): 40 mg via ORAL
  Filled 2016-04-12 (×4): qty 1

## 2016-04-12 MED ORDER — FENTANYL CITRATE (PF) 100 MCG/2ML IJ SOLN: 25.0000 ug | INTRAMUSCULAR | Status: DC | PRN

## 2016-04-12 MED ORDER — CEFAZOLIN SODIUM-DEXTROSE 2-3 GM-% IV SOLR
2.0000 g | Freq: Four times a day (QID) | INTRAVENOUS | Status: AC
  Administered 2016-04-12 – 2016-04-13 (×4): 2 g via INTRAVENOUS
  Filled 2016-04-12 (×4): qty 50

## 2016-04-12 MED ORDER — PROMETHAZINE HCL 25 MG/ML IJ SOLN
INTRAMUSCULAR | Status: AC
  Administered 2016-04-12: 6.25 mg via INTRAVENOUS
  Filled 2016-04-12: qty 1

## 2016-04-12 MED ORDER — ALUM & MAG HYDROXIDE-SIMETH 200-200-20 MG/5ML PO SUSP: 30.0000 mL | ORAL | Status: DC | PRN

## 2016-04-12 MED ORDER — ACETAMINOPHEN 10 MG/ML IV SOLN
INTRAVENOUS | Status: DC | PRN
  Administered 2016-04-12: 1000 mg via INTRAVENOUS

## 2016-04-12 MED ORDER — ACETAMINOPHEN 10 MG/ML IV SOLN
INTRAVENOUS | Status: AC
  Filled 2016-04-12: qty 100

## 2016-04-12 MED ORDER — ENOXAPARIN SODIUM 30 MG/0.3ML ~~LOC~~ SOLN
30.0000 mg | Freq: Two times a day (BID) | SUBCUTANEOUS | Status: DC
  Administered 2016-04-13 – 2016-04-14 (×3): 30 mg via SUBCUTANEOUS
  Filled 2016-04-12 (×3): qty 0.3

## 2016-04-12 MED ORDER — MORPHINE SULFATE (PF) 2 MG/ML IV SOLN
2.0000 mg | INTRAVENOUS | Status: DC | PRN
  Administered 2016-04-12: 2 mg via INTRAVENOUS
  Filled 2016-04-12: qty 1

## 2016-04-12 MED ORDER — ADULT MULTIVITAMIN W/MINERALS CH
1.0000 | ORAL_TABLET | Freq: Every day | ORAL | Status: DC
  Administered 2016-04-13: 1 via ORAL
  Filled 2016-04-12: qty 1

## 2016-04-12 MED ORDER — CHLORHEXIDINE GLUCONATE 4 % EX LIQD: 60.0000 mL | Freq: Once | CUTANEOUS | Status: DC

## 2016-04-12 MED ORDER — ACETAMINOPHEN 325 MG PO TABS
650.0000 mg | ORAL_TABLET | Freq: Four times a day (QID) | ORAL | Status: DC | PRN
Start: 2016-04-12 — End: 2016-04-14

## 2016-04-12 MED ORDER — FERROUS SULFATE 325 (65 FE) MG PO TABS
325.0000 mg | ORAL_TABLET | Freq: Two times a day (BID) | ORAL | Status: DC
  Administered 2016-04-13 – 2016-04-14 (×3): 325 mg via ORAL
  Filled 2016-04-12 (×3): qty 1

## 2016-04-12 MED ORDER — CEFAZOLIN SODIUM-DEXTROSE 2-3 GM-% IV SOLR
INTRAVENOUS | Status: DC | PRN
  Administered 2016-04-12: 2 g via INTRAVENOUS

## 2016-04-12 MED ORDER — FLEET ENEMA 7-19 GM/118ML RE ENEM: 1.0000 | ENEMA | Freq: Once | RECTAL | Status: DC | PRN

## 2016-04-12 MED ORDER — SENNOSIDES-DOCUSATE SODIUM 8.6-50 MG PO TABS
1.0000 | ORAL_TABLET | Freq: Two times a day (BID) | ORAL | Status: DC
  Administered 2016-04-12 – 2016-04-14 (×3): 1 via ORAL
  Filled 2016-04-12 (×4): qty 1

## 2016-04-12 MED ORDER — KETAMINE HCL 10 MG/ML IJ SOLN
INTRAMUSCULAR | Status: DC | PRN
  Administered 2016-04-12: 50 mg via INTRAVENOUS

## 2016-04-12 MED ORDER — BUPIVACAINE HCL (PF) 0.5 % IJ SOLN
INTRAMUSCULAR | Status: AC
  Filled 2016-04-12: qty 10

## 2016-04-12 MED ORDER — ACETAMINOPHEN 10 MG/ML IV SOLN
1000.0000 mg | Freq: Four times a day (QID) | INTRAVENOUS | Status: AC
  Administered 2016-04-12 – 2016-04-13 (×4): 1000 mg via INTRAVENOUS
  Filled 2016-04-12 (×4): qty 100

## 2016-04-12 MED ORDER — ONDANSETRON HCL 4 MG PO TABS: 4.0000 mg | ORAL_TABLET | Freq: Four times a day (QID) | ORAL | Status: DC | PRN

## 2016-04-12 MED ORDER — LIDOCAINE HCL (PF) 2 % IJ SOLN
INTRAMUSCULAR | Status: DC | PRN
  Administered 2016-04-12: 50 mg

## 2016-04-12 MED ORDER — CEFAZOLIN SODIUM-DEXTROSE 2-4 GM/100ML-% IV SOLN
INTRAVENOUS | Status: AC
  Filled 2016-04-12: qty 100

## 2016-04-12 MED ORDER — PHENYLEPHRINE HCL 10 MG/ML IJ SOLN
INTRAMUSCULAR | Status: DC | PRN
  Administered 2016-04-12: 50 ug via INTRAVENOUS
  Administered 2016-04-12: 100 ug via INTRAVENOUS
  Administered 2016-04-12: 50 ug via INTRAVENOUS

## 2016-04-12 MED ORDER — TETRACAINE HCL 1 % IJ SOLN
INTRAMUSCULAR | Status: DC | PRN
  Administered 2016-04-12: 10 mg via INTRASPINAL

## 2016-04-12 MED ORDER — NEOMYCIN-POLYMYXIN B GU 40-200000 IR SOLN
Status: AC
  Filled 2016-04-12: qty 20

## 2016-04-12 MED ORDER — PROPOFOL 500 MG/50ML IV EMUL
INTRAVENOUS | Status: AC
  Filled 2016-04-12: qty 50

## 2016-04-12 MED ORDER — PROPOFOL 10 MG/ML IV BOLUS
INTRAVENOUS | Status: AC
  Filled 2016-04-12: qty 20

## 2016-04-12 MED ORDER — MEPERIDINE HCL 50 MG/ML IJ SOLN
12.5000 mg | INTRAMUSCULAR | Status: DC | PRN
  Administered 2016-04-12: 12.5 mg via INTRAVENOUS

## 2016-04-12 MED ORDER — MIDAZOLAM HCL 2 MG/2ML IJ SOLN
INTRAMUSCULAR | Status: AC
  Filled 2016-04-12: qty 2

## 2016-04-12 MED ORDER — ONDANSETRON HCL 4 MG/2ML IJ SOLN
4.0000 mg | Freq: Four times a day (QID) | INTRAMUSCULAR | Status: DC | PRN
  Administered 2016-04-12 – 2016-04-13 (×2): 4 mg via INTRAVENOUS
  Filled 2016-04-12 (×2): qty 2

## 2016-04-12 MED ORDER — SODIUM CHLORIDE 0.9 % IV SOLN
INTRAVENOUS | Status: DC | PRN
Start: 2016-04-12 — End: 2016-04-12
  Administered 2016-04-12: 1000 mg via INTRAVENOUS

## 2016-04-12 MED ORDER — SODIUM CHLORIDE 0.9 % IJ SOLN
INTRAMUSCULAR | Status: AC
  Filled 2016-04-12: qty 20

## 2016-04-12 MED ORDER — GLYCOPYRROLATE 0.2 MG/ML IJ SOLN
INTRAMUSCULAR | Status: AC
  Filled 2016-04-12: qty 1

## 2016-04-12 MED ORDER — MAGNESIUM HYDROXIDE 400 MG/5ML PO SUSP
30.0000 mL | Freq: Every day | ORAL | Status: DC | PRN
  Administered 2016-04-14: 30 mL via ORAL
  Filled 2016-04-12: qty 30

## 2016-04-12 MED ORDER — MEPERIDINE HCL 50 MG/ML IJ SOLN
INTRAMUSCULAR | Status: AC
  Filled 2016-04-12: qty 1

## 2016-04-12 MED ORDER — TETRACAINE HCL 1 % IJ SOLN
INTRAMUSCULAR | Status: AC
  Filled 2016-04-12: qty 2

## 2016-04-12 MED ORDER — PHENOL 1.4 % MT LIQD
1.0000 | OROMUCOSAL | Status: DC | PRN
  Filled 2016-04-12: qty 177

## 2016-04-12 MED ORDER — BUPIVACAINE HCL (PF) 0.5 % IJ SOLN
INTRAMUSCULAR | Status: DC | PRN
  Administered 2016-04-12: 2 mL via INTRATHECAL

## 2016-04-12 MED ORDER — ONDANSETRON HCL 4 MG/2ML IJ SOLN: 4.0000 mg | Freq: Four times a day (QID) | INTRAMUSCULAR | Status: DC | PRN

## 2016-04-12 MED ORDER — CEFAZOLIN SODIUM-DEXTROSE 2-4 GM/100ML-% IV SOLN: 2.0000 g | Freq: Four times a day (QID) | INTRAVENOUS | Status: DC

## 2016-04-12 MED ORDER — OXYCODONE HCL 5 MG PO TABS
5.0000 mg | ORAL_TABLET | ORAL | Status: DC | PRN
  Administered 2016-04-12 – 2016-04-13 (×2): 5 mg via ORAL
  Filled 2016-04-12 (×2): qty 1

## 2016-04-12 MED ORDER — SODIUM CHLORIDE 0.9 % IV SOLN
INTRAVENOUS | Status: DC
  Administered 2016-04-12 – 2016-04-14 (×3): via INTRAVENOUS

## 2016-04-12 MED ORDER — LACTATED RINGERS IV SOLN
INTRAVENOUS | Status: DC
  Administered 2016-04-12 (×2): via INTRAVENOUS

## 2016-04-12 MED ORDER — CELECOXIB 200 MG PO CAPS
200.0000 mg | ORAL_CAPSULE | Freq: Two times a day (BID) | ORAL | Status: DC
  Administered 2016-04-12 – 2016-04-14 (×4): 200 mg via ORAL
  Filled 2016-04-12 (×4): qty 1

## 2016-04-12 MED ORDER — TRANEXAMIC ACID 1000 MG/10ML IV SOLN
1000.0000 mg | Freq: Once | INTRAVENOUS | Status: AC
  Administered 2016-04-12: 1000 mg via INTRAVENOUS
  Filled 2016-04-12: qty 10

## 2016-04-12 MED ORDER — ONDANSETRON HCL 4 MG/2ML IJ SOLN
INTRAMUSCULAR | Status: AC
  Filled 2016-04-12: qty 2

## 2016-04-12 MED ORDER — FENTANYL CITRATE (PF) 100 MCG/2ML IJ SOLN
INTRAMUSCULAR | Status: AC
  Filled 2016-04-12: qty 2

## 2016-04-12 MED ORDER — ONDANSETRON HCL 4 MG/2ML IJ SOLN
INTRAMUSCULAR | Status: DC | PRN
  Administered 2016-04-12: 4 mg via INTRAVENOUS

## 2016-04-12 MED ORDER — FENTANYL CITRATE (PF) 100 MCG/2ML IJ SOLN
INTRAMUSCULAR | Status: DC | PRN
  Administered 2016-04-12 (×4): 25 ug via INTRAVENOUS

## 2016-04-12 MED ORDER — PROMETHAZINE HCL 25 MG/ML IJ SOLN
6.2500 mg | INTRAMUSCULAR | Status: AC | PRN
  Administered 2016-04-12 (×2): 6.25 mg via INTRAVENOUS

## 2016-04-12 MED ORDER — NEOMYCIN-POLYMYXIN B GU 40-200000 IR SOLN
Status: DC | PRN
  Administered 2016-04-12: 14 mL

## 2016-04-12 MED ORDER — BISACODYL 10 MG RE SUPP: 10.0000 mg | Freq: Every day | RECTAL | Status: DC | PRN

## 2016-04-12 MED ORDER — DIPHENHYDRAMINE HCL 12.5 MG/5ML PO ELIX: 12.5000 mg | ORAL_SOLUTION | ORAL | Status: DC | PRN

## 2016-04-12 MED ORDER — TRAMADOL HCL 50 MG PO TABS: 50.0000 mg | ORAL_TABLET | ORAL | Status: DC | PRN

## 2016-04-12 MED ORDER — FAMOTIDINE 20 MG PO TABS
ORAL_TABLET | ORAL | Status: AC
  Administered 2016-04-12: 20 mg via ORAL
  Filled 2016-04-12: qty 1

## 2016-04-12 MED ORDER — ACETAMINOPHEN 650 MG RE SUPP: 650.0000 mg | Freq: Four times a day (QID) | RECTAL | Status: DC | PRN

## 2016-04-12 MED ORDER — PROPOFOL 500 MG/50ML IV EMUL
INTRAVENOUS | Status: DC | PRN
  Administered 2016-04-12: 50 ug/kg/min via INTRAVENOUS

## 2016-04-12 MED ORDER — CALCIUM CARBONATE-VITAMIN D 500-200 MG-UNIT PO TABS
1.0000 | ORAL_TABLET | Freq: Every day | ORAL | Status: DC
  Administered 2016-04-13: 1 via ORAL
  Filled 2016-04-12: qty 1

## 2016-04-12 MED ORDER — GLYCOPYRROLATE 0.2 MG/ML IJ SOLN
INTRAMUSCULAR | Status: DC | PRN
  Administered 2016-04-12: 0.2 mg via INTRAVENOUS

## 2016-04-12 MED ORDER — MIDAZOLAM HCL 5 MG/5ML IJ SOLN
INTRAMUSCULAR | Status: DC | PRN
  Administered 2016-04-12: 2 mg via INTRAVENOUS

## 2016-04-12 MED ORDER — METOCLOPRAMIDE HCL 10 MG PO TABS
10.0000 mg | ORAL_TABLET | Freq: Three times a day (TID) | ORAL | Status: DC
Start: 2016-04-12 — End: 2016-04-14
  Administered 2016-04-12 – 2016-04-14 (×5): 10 mg via ORAL
  Filled 2016-04-12 (×6): qty 1

## 2016-04-12 MED ORDER — MENTHOL 3 MG MT LOZG
1.0000 | LOZENGE | OROMUCOSAL | Status: DC | PRN
  Filled 2016-04-12: qty 9

## 2016-04-12 SURGICAL SUPPLY — 55 items
BLADE DRUM FLTD (BLADE) ×3 IMPLANT
BLADE SAW 1 (BLADE) ×3 IMPLANT
CANISTER SUCT 1200ML W/VALVE (MISCELLANEOUS) ×3 IMPLANT
CANISTER SUCT 3000ML (MISCELLANEOUS) ×6 IMPLANT
CAPT HIP TOTAL 2 ×3 IMPLANT
CARTRIDGE OIL MAESTRO DRILL (MISCELLANEOUS) ×1 IMPLANT
CATH FOL LEG HOLDER (MISCELLANEOUS) ×3 IMPLANT
CATH TRAY METER 16FR LF (MISCELLANEOUS) ×3 IMPLANT
DIFFUSER MAESTRO (MISCELLANEOUS) ×3 IMPLANT
DRAPE INCISE IOBAN 66X60 STRL (DRAPES) ×3 IMPLANT
DRAPE SHEET LG 3/4 BI-LAMINATE (DRAPES) ×3 IMPLANT
DRSG DERMACEA 8X12 NADH (GAUZE/BANDAGES/DRESSINGS) ×3 IMPLANT
DRSG OPSITE POSTOP 3X4 (GAUZE/BANDAGES/DRESSINGS) IMPLANT
DRSG OPSITE POSTOP 4X12 (GAUZE/BANDAGES/DRESSINGS) ×3 IMPLANT
DRSG OPSITE POSTOP 4X14 (GAUZE/BANDAGES/DRESSINGS) IMPLANT
DRSG TEGADERM 4X4.75 (GAUZE/BANDAGES/DRESSINGS) ×3 IMPLANT
DURAPREP 26ML APPLICATOR (WOUND CARE) ×3 IMPLANT
ELECT BLADE 6.5 EXT (BLADE) ×3 IMPLANT
ELECT CAUTERY BLADE 6.4 (BLADE) ×3 IMPLANT
GLOVE BIO SURGEON STRL SZ7 (GLOVE) ×12 IMPLANT
GLOVE BIOGEL M STRL SZ7.5 (GLOVE) ×6 IMPLANT
GLOVE BIOGEL PI IND STRL 9 (GLOVE) ×1 IMPLANT
GLOVE BIOGEL PI INDICATOR 9 (GLOVE) ×2
GLOVE INDICATOR 7.5 STRL GRN (GLOVE) ×12 IMPLANT
GLOVE INDICATOR 8.0 STRL GRN (GLOVE) ×3 IMPLANT
GLOVE SURG SYN 9.0  PF PI (GLOVE) ×2
GLOVE SURG SYN 9.0 PF PI (GLOVE) ×1 IMPLANT
GOWN STRL REUS W/ TWL LRG LVL3 (GOWN DISPOSABLE) ×3 IMPLANT
GOWN STRL REUS W/TWL 2XL LVL3 (GOWN DISPOSABLE) ×3 IMPLANT
GOWN STRL REUS W/TWL LRG LVL3 (GOWN DISPOSABLE) ×6
HANDPIECE INTERPULSE COAX TIP (DISPOSABLE) ×2
HEMOVAC 400CC 10FR (MISCELLANEOUS) ×3 IMPLANT
HOOD PEEL AWAY FLYTE STAYCOOL (MISCELLANEOUS) ×6 IMPLANT
KIT RM TURNOVER STRD PROC AR (KITS) ×3 IMPLANT
NDL SAFETY 18GX1.5 (NEEDLE) ×3 IMPLANT
NS IRRIG 500ML POUR BTL (IV SOLUTION) ×3 IMPLANT
OIL CARTRIDGE MAESTRO DRILL (MISCELLANEOUS) ×3
PACK HIP PROSTHESIS (MISCELLANEOUS) ×3 IMPLANT
PIN STEIN THRED 5/32 (Pin) ×3 IMPLANT
SET HNDPC FAN SPRY TIP SCT (DISPOSABLE) ×1 IMPLANT
SOL .9 NS 3000ML IRR  AL (IV SOLUTION) ×2
SOL .9 NS 3000ML IRR UROMATIC (IV SOLUTION) ×1 IMPLANT
SOL PREP PVP 2OZ (MISCELLANEOUS) ×3
SOLUTION PREP PVP 2OZ (MISCELLANEOUS) ×1 IMPLANT
SPONGE DRAIN TRACH 4X4 STRL 2S (GAUZE/BANDAGES/DRESSINGS) ×3 IMPLANT
STAPLER SKIN PROX 35W (STAPLE) ×3 IMPLANT
SUT ETHIBOND #5 BRAIDED 30INL (SUTURE) ×3 IMPLANT
SUT VIC AB 0 CT1 36 (SUTURE) ×3 IMPLANT
SUT VIC AB 1 CT1 36 (SUTURE) ×6 IMPLANT
SUT VIC AB 2-0 CT1 27 (SUTURE) ×2
SUT VIC AB 2-0 CT1 TAPERPNT 27 (SUTURE) ×1 IMPLANT
SYR 20CC LL (SYRINGE) ×3 IMPLANT
TAPE ADH 3 LX (MISCELLANEOUS) ×3 IMPLANT
TAPE TRANSPORE STRL 2 31045 (GAUZE/BANDAGES/DRESSINGS) ×3 IMPLANT
TOWEL OR 17X26 4PK STRL BLUE (TOWEL DISPOSABLE) ×3 IMPLANT

## 2016-04-12 NOTE — Progress Notes (Signed)
Called to floor  Family in room 156  Updated family  Pt nauseated and pt will be in pacu for a while

## 2016-04-12 NOTE — Anesthesia Procedure Notes (Signed)
Spinal  Patient location during procedure: OR Staffing Anesthesiologist: Katy Fitch K Resident/CRNA: Rolla Plate Performed: resident/CRNA  Preanesthetic Checklist Completed: patient identified, site marked, surgical consent, pre-op evaluation, timeout performed, IV checked, risks and benefits discussed and monitors and equipment checked Spinal Block Patient position: sitting Prep: ChloraPrep and site prepped and draped Patient monitoring: heart rate, continuous pulse ox, blood pressure and cardiac monitor Approach: midline Location: L4-5 Injection technique: single-shot Needle Needle type: Whitacre and Introducer  Needle gauge: 24 G Needle length: 9 cm Additional Notes Negative paresthesia. Negative blood return. Positive free-flowing CSF. Expiration date of kit checked and confirmed. Patient tolerated procedure well, without complications.

## 2016-04-12 NOTE — Progress Notes (Signed)
Receiving xrays   Pt very cold and slivering   Applied warm blankets and bair hugger

## 2016-04-12 NOTE — H&P (Signed)
The patient has been re-examined, and the chart reviewed, and there have been no interval changes to the documented history and physical.    The risks, benefits, and alternatives have been discussed at length. The patient expressed understanding of the risks benefits and agreed with plans for surgical intervention.  Tonio Seider P. Delesha Pohlman, Jr. M.D.    

## 2016-04-12 NOTE — OR Nursing (Signed)
Dr Marry Guan asked to review labs and faxes from office.  Patient never received antibiotic.

## 2016-04-12 NOTE — Anesthesia Post-op Follow-up Note (Cosign Needed)
Anesthesia QCDR form completed.        

## 2016-04-12 NOTE — Progress Notes (Signed)
Heart rate up to 150  Very nauseated and cold   Phenergan given for nausea and demerol given for slivering

## 2016-04-12 NOTE — Anesthesia Preprocedure Evaluation (Signed)
Anesthesia Evaluation  Patient identified by MRN, date of birth, ID band Patient awake    Reviewed: Allergy & Precautions, H&P , NPO status , Patient's Chart, lab work & pertinent test results  History of Anesthesia Complications (+) PONV and history of anesthetic complications  Airway Mallampati: II  TM Distance: >3 FB Neck ROM: limited    Dental  (+) Poor Dentition, Chipped, Caps   Pulmonary neg pulmonary ROS, neg shortness of breath,    Pulmonary exam normal breath sounds clear to auscultation       Cardiovascular Exercise Tolerance: Good (-) angina(-) Past MI and (-) DOE negative cardio ROS Normal cardiovascular exam Rhythm:regular Rate:Normal     Neuro/Psych  Headaches, negative psych ROS   GI/Hepatic negative GI ROS, Neg liver ROS, neg GERD  ,  Endo/Other  negative endocrine ROS  Renal/GU      Musculoskeletal  (+) Arthritis ,   Abdominal   Peds  Hematology negative hematology ROS (+)   Anesthesia Other Findings Past Medical History: No date: Cortical cataract No date: DDD (degenerative disc disease), cervical No date: DDD (degenerative disc disease), cervical No date: Diverticulitis No date: Hyperlipemia No date: Migraines     Comment: history of migraines No date: Pituitary adenoma ([place]) No date: PONV (postoperative nausea and vomiting) No date: Vitamin D deficiency  Past Surgical History: 03/08/2016: ANTERIOR CERVICAL DECOMP/DISCECTOMY FUSION Left     Comment: Procedure: ANTERIOR CERVICAL               DECOMPRESSION/DISCECTOMY FUSION 3 LEVELS;                Surgeon: Deetta Perla, MD;  Location: [facility] ORS;               Service: Neurosurgery;  Laterality: Left; No date: CHOLECYSTECTOMY 12/04/2014: COLONOSCOPY WITH PROPOFOL N/A     Comment: Procedure: COLONOSCOPY WITH PROPOFOL;                Surgeon: Manya Silvas, MD;  Location: [facility]               ENDOSCOPY;  Service: Endoscopy;  Laterality:                N/A; No date: Degenerative Disc No date: EYE SURGERY     Comment: cataract os No date: TUBAL LIGATION  BMI    Body Mass Index:  19.73 kg/m      Reproductive/Obstetrics negative OB ROS                             Anesthesia Physical Anesthesia Plan  ASA: III  Anesthesia Plan: Spinal   Post-op Pain Management:    Induction:   Airway Management Planned:   Additional Equipment:   Intra-op Plan:   Post-operative Plan:   Informed Consent: I have reviewed the patients History and Physical, chart, labs and discussed the procedure including the risks, benefits and alternatives for the proposed anesthesia with the patient or authorized representative who has indicated his/her understanding and acceptance.   Dental Advisory Given  Plan Discussed with: Anesthesiologist, CRNA and Surgeon  Anesthesia Plan Comments:         Anesthesia Quick Evaluation

## 2016-04-12 NOTE — Progress Notes (Signed)
Patient is admitted to room 156 from surgery via room bed. Family at bedside. A&O x4, but sleepy. Having nausea. Foley, hemovac, TEDs, foot pumps, and NSL in place. Oriented to room, call light, TV and bed controls. Bed alarm on for safety. Honeycomb dressing to right hip intact.

## 2016-04-12 NOTE — Transfer of Care (Signed)
Immediate Anesthesia Transfer of Care Note  Patient: Morgan Lam  Procedure(s) Performed: Procedure(s): TOTAL HIP ARTHROPLASTY (Right)  Patient Location: PACU  Anesthesia Type:Spinal  Level of Consciousness: sedated  Airway & Oxygen Therapy: Patient Spontanous Breathing and Patient connected to face mask oxygen  Post-op Assessment: Report given to RN and Post -op Vital signs reviewed and stable  Post vital signs: Reviewed and stable  Last Vitals:  Vitals:   04/12/16 1043  BP: (!) 182/81  Resp: 16  Temp: 36.2 C    Last Pain:  Vitals:   04/12/16 1043  TempSrc: Tympanic         Complications: No apparent anesthesia complications

## 2016-04-12 NOTE — Progress Notes (Signed)
Sleeping

## 2016-04-12 NOTE — Progress Notes (Signed)
Awaken and became nauseated again  Another dose of phenergan 6.25 given

## 2016-04-12 NOTE — Op Note (Signed)
OPERATIVE NOTE  DATE OF SURGERY:  04/12/2016  PATIENT NAME:  Morgan Lam   DOB: June 24, 1955  MRN: [id number]  PRE-OPERATIVE DIAGNOSIS: Degenerative arthrosis of the right hip, primary  POST-OPERATIVE DIAGNOSIS:  Same  PROCEDURE:  Right total hip arthroplasty  SURGEON:  Marciano Sequin. M.D.  ASSISTANT:  Vance Peper, PA (present and scrubbed throughout the case, critical for assistance with exposure, retraction, instrumentation, and closure)  ANESTHESIA: spinal  ESTIMATED BLOOD LOSS: 50 mL  FLUIDS REPLACED: 1500 mL of crystalloid  DRAINS: 2 medium drains to a Hemovac reservoir  IMPLANTS UTILIZED: DePuy 13.5 mm small stature AML femoral stem, 52 mm OD Pinnacle 100 acetabular component, +4 mm 10 Pinnacle Marathon polyethylene insert, and a 36 mm M-SPEC +1.5 mm hip ball  INDICATIONS FOR SURGERY: Morgan Lam is a 61 y.o. year old female with a long history of progressive hip and groin  pain. X-rays demonstrated severe degenerative changes. The patient had not seen any significant improvement despite conservative nonsurgical intervention. After discussion of the risks and benefits of surgical intervention, the patient expressed understanding of the risks benefits and agree with plans for total hip arthroplasty.   The risks, benefits, and alternatives were discussed at length including but not limited to the risks of infection, bleeding, nerve injury, stiffness, blood clots, the need for revision surgery, limb length inequality, dislocation, cardiopulmonary complications, among others, and they were willing to proceed.  PROCEDURE IN DETAIL: The patient was brought into the operating room and, after adequate spinal anesthesia was achieved, the patient was placed in a left lateral decubitus position. Axillary roll was placed and all bony prominences were well-padded. The patient's right hip was cleaned and prepped with alcohol and DuraPrep and draped in the usual sterile fashion. A  "timeout" was performed as per usual protocol. A lateral curvilinear incision was made gently curving towards the posterior superior iliac spine. The IT band was incised in line with the skin incision and the fibers of the gluteus maximus were split in line. The piriformis tendon was identified, skeletonized, and incised at its insertion to the proximal femur and reflected posteriorly. A T type posterior capsulotomy was performed. Prior to dislocation of the femoral head, a threaded Steinmann pin was inserted through a separate stab incision into the pelvis superior to the acetabulum and bent in the form of a stylus so as to assess limb length and hip offset throughout the procedure. The femoral head was then dislocated posteriorly. Inspection of the femoral head demonstrated severe degenerative changes with full-thickness loss of articular cartilage. The femoral neck cut was performed using an oscillating saw. The anterior capsule was elevated off of the femoral neck using a periosteal elevator. Attention was then directed to the acetabulum. The remnant of the labrum was excised using electrocautery. Inspection of the acetabulum also demonstrated significant degenerative changes. The acetabulum was reamed in sequential fashion up to a 51 mm diameter. Good punctate bleeding bone was encountered. A 52 mm Pinnacle 100 acetabular component was positioned and impacted into place. Good scratch fit was appreciated. Circumferential osteophytes were removed using osteotomes and a rongeur. A +4 mm neutral polyethylene trial was inserted.  Attention was then directed to the proximal femur. A hole for reaming of the proximal femoral canal was created using a high-speed burr. The femoral canal was reamed in sequential fashion up to a 13 mm diameter. This allowed for approximately 6 cm of scratch fit. Serial broaches were inserted up to a 13.5 mm small  stature femoral broach. Calcar region was planed and a trial reduction was  performed using a 36 mm hip ball with a +1.5 mm neck length. Reasonably good stability was noted, but it was elected to trial the +4 mm 10 liner with the high side at the 8:00 position. Good equalization of limb lengths and hip offset was appreciated and excellent stability was noted both anteriorly and posteriorly. Trial components were removed. The acetabular shell was irrigated with copious amounts of normal saline with antibiotic solution and suctioned dry. A +4 mm 10 Pinnacle Marathon polyethylene insert was positioned with the high side at the 8:00 position and impacted into place. Next, a 13.5 mm small stature AML femoral stem was positioned and impacted into place. Excellent scratch fit was appreciated. A trial reduction was again performed with a 36 mm hip ball with a +1.5 mm neck length. Again, good equalization of limb lengths was appreciated and excellent stability appreciated both anteriorly and posteriorly. The hip was then dislocated and the trial hip ball was removed. The Morse taper was cleaned and dried. A 36 mm M-SPEC hip ball with a +1.5 mm neck length was placed on the trunnion and impacted into place. The hip was then reduced and placed through range of motion. Excellent stability was appreciated both anteriorly and posteriorly.  The wound was irrigated with copious amounts of normal saline with antibiotic solution and suctioned dry. Good hemostasis was appreciated. The posterior capsulotomy was repaired using #5 Ethibond. Piriformis tendon was reapproximated to the undersurface of the gluteus medius tendon using #5 Ethibond. Two medium drains were placed in the wound bed and brought out through separate stab incisions to be attached to a Hemovac reservoir. The IT band was reapproximated using interrupted sutures of #1 Vicryl. Subcutaneous tissue was approximated using first #0 Vicryl followed by #2-0 Vicryl. The skin was closed with skin staples.  The patient tolerated the procedure well  and was transported to the recovery room in stable condition.   Marciano Sequin., M.D.

## 2016-04-13 ENCOUNTER — Encounter: Payer: Self-pay | Admitting: Orthopedic Surgery

## 2016-04-13 LAB — BASIC METABOLIC PANEL
Anion gap: 7 (ref 5–15)
BUN: 6 mg/dL (ref 6–20)
CO2: 25 mmol/L (ref 22–32)
CREATININE: 0.68 mg/dL (ref 0.44–1.00)
Calcium: 8.4 mg/dL — ABNORMAL LOW (ref 8.9–10.3)
Chloride: 105 mmol/L (ref 101–111)
GFR calc Af Amer: >60 mL/min (ref >60)
GLUCOSE: 97 mg/dL (ref 65–99)
POTASSIUM: 3.3 mmol/L — AB (ref 3.5–5.1)
Sodium: 137 mmol/L (ref 135–145)

## 2016-04-13 LAB — CBC
HCT: 29.1 % — ABNORMAL LOW (ref 35.0–47.0)
Hemoglobin: 9.8 g/dL — ABNORMAL LOW (ref 12.0–16.0)
MCH: 28.5 pg (ref 26.0–34.0)
MCHC: 33.7 g/dL (ref 32.0–36.0)
MCV: 84.6 fL (ref 80.0–100.0)
PLATELETS: 179 10*3/uL (ref 150–440)
RBC: 3.45 MIL/uL — ABNORMAL LOW (ref 3.80–5.20)
RDW: 13.7 % (ref 11.5–14.5)
WBC: 12.4 10*3/uL — ABNORMAL HIGH (ref 3.6–11.0)

## 2016-04-13 MED ORDER — POTASSIUM CHLORIDE CRYS ER 20 MEQ PO TBCR
20.0000 meq | EXTENDED_RELEASE_TABLET | Freq: Three times a day (TID) | ORAL | Status: AC
  Administered 2016-04-13 – 2016-04-14 (×3): 20 meq via ORAL
  Filled 2016-04-13 (×3): qty 1

## 2016-04-13 NOTE — Care Management Note (Signed)
Case Management Note  Patient Details  Name: TYRIKA NEWMAN MRN: [id number] Date of Birth: 19-Oct-1955  Subjective/Objective:  Met with patient at bedside. She is nauseated so she didn't talk very much. Lives at home with spouse. Offered choice of home health agencies. Referral to [facility] for HHPT. Ordered a walker and bsc from [facility].  Pharmacy: [facility] [phone]. Called Lovenox 40 mg # 14 no refills.                  Action/Plan: [facility] for HHPT, Lovenox called in. Ordered DME from [facility]   Expected Discharge Date:  04/14/16               Expected Discharge Plan:  [place]  In-House Referral:     Discharge planning Services  CM Consult  Post Acute Care Choice:  Home Health Choice offered to:  Patient  DME Arranged:  Walker rolling, Bedside commode DME Agency:  [place]:  PT [place]:  [facility] (now [facility])  Status of Service:  In process, will continue to follow  If discussed at Long Length of Stay Meetings, dates discussed:    Additional Comments:  Jolly Mango, RN 04/13/2016, 2:20 PM

## 2016-04-13 NOTE — Anesthesia Postprocedure Evaluation (Signed)
Anesthesia Post Note  Patient: Morgan Lam  Procedure(s) Performed: Procedure(s) (LRB): TOTAL HIP ARTHROPLASTY (Right)  Patient location during evaluation: Nursing Unit Anesthesia Type: Spinal Level of consciousness: awake, awake and alert and oriented Pain management: pain level controlled Vital Signs Assessment: post-procedure vital signs reviewed and stable Respiratory status: spontaneous breathing Cardiovascular status: blood pressure returned to baseline Postop Assessment: no headache, no backache, no signs of nausea or vomiting and adequate PO intake Anesthetic complications: no     Last Vitals:  Vitals:   04/12/16 2315 04/13/16 0433  BP: (!) 159/59 (!) 151/60  Pulse: 76 85  Resp: 16 18  Temp: 37.5 C 36.8 C    Last Pain:  Vitals:   04/13/16 0631  TempSrc:   PainSc: 2                  Swara Donze Lorenza Chick

## 2016-04-13 NOTE — Progress Notes (Signed)
Pt alert and oriented.  Pt encouraged to use incentive spirometer every hour while awake. Right hip dsg dry and intact. Dangled at bedside with no complaints. Pt had dry heaves during the night. VSS. Staff  Will continue to monitor.

## 2016-04-13 NOTE — Progress Notes (Signed)
ORTHOPAEDICS PROGRESS NOTE  PATIENT NAME: Morgan Lam DOB: [birth year]  MRN: [id number]  POD # 1: Right total hip arthroplasty  Subjective: The patient is resting well and states that the pain is under good control. The patient had significant nausea postoperatively yesterday but states that the nausea has resolved at this point. She sat at bedside and dangled her feet as per nursing.  Objective: Vital signs in last 24 hours: Temp:  [97.1 F (36.2 C)-99.5 F (37.5 C)] 98.3 F (36.8 C) (04/03 0433) Pulse Rate:  [67-128] 85 (04/03 0433) Resp:  [11-23] 18 (04/03 0433) BP: (132-182)/(59-95) 151/60 (04/03 0433) SpO2:  [100 %] 100 % (04/03 0433) Weight:  [57.2 kg (126 lb)-57.7 kg (127 lb 5 oz)] 57.7 kg (127 lb 5 oz) (04/02 1753)  Intake/Output from previous day: 04/02 0701 - 04/03 0700 In: 3180 [I.V.:3070; IV Piggyback:110] Out: 1905 [Urine:1725; Drains:130; Blood:50]   Recent Labs  04/13/16 0516  WBC 12.4*  HGB 9.8*  HCT 29.1*  PLT 179  K 3.3*  CL 105  CO2 25  BUN 6  CREATININE 0.68  GLUCOSE 97  CALCIUM 8.4*    EXAM General: Well-developed well-nourished female seen in no acute distress Lungs: clear to auscultation Cardiac: normal rate, regular rhythm, normal S1, S2, no murmurs, rubs, clicks or gallops, normal rate and regular rhythm Right lower extremity: Right hip dressing is clean and dry. Hemovac is intact. No significant thigh swelling. Homans test is negative. Neurologic: Awake, alert, and oriented. Sensory and motor function are intact.  Assessment: Right total hip arthroplasty  Secondary diagnoses: Acute blood loss anemia secondary to surgery Hypokalemia, mild Postoperative nausea and vomiting Vitamin D deficiency Hyperlipidemia Diverticulitis Migraines Pituitary adenoma Status post anterior cervical fusion  Plan: Today's goal were reviewed with the patient.  Begin physical therapy and occupational therapy as per total hip arthroplasty  rehabilitation protocol. Supplement potassium today. Repeat labs in the morning. Plan is to go Home after hospital stay. DVT Prophylaxis - Lovenox, Foot Pumps and TED hose  James P. Holley Bouche M.D.

## 2016-04-13 NOTE — Evaluation (Signed)
Physical Therapy Evaluation Patient Details Name: Morgan Lam MRN: [id number] DOB: 1955-12-08 Today's Date: 04/13/2016   History of Present Illness  Pt is a 61 yo female, s/p R THA (posterior hip precautions, WBAT) POD#1 for PT evaluation. PMHx includes DDD, HTN, HLD, cervical myelopathy, diverticulitis, recent Anterior decompression/disectomy and fusion of C3/4, C4/5, C5/6 vertebrae on 03/08/16.   Clinical Impression  Pt is a pleasant 61 year old female who was admitted for R THR. Pt performs bed mobility with min assist, transfers with cga, and ambulation with cga and RW. Pt educated on hip precautions, needs cues to maintain with mobility efforts. Pt tends to want to IR R hip, cues given for proper positioning. Pt demonstrates deficits with strength/pain/mobility. Pt performed all mobility on room air with sats at 99%. RN notified that pt left off of O2.Would benefit from skilled PT to address above deficits and promote optimal return to PLOF. Recommend transition to [place] upon discharge from acute hospitalization.       Follow Up Recommendations Home health PT    Equipment Recommendations  Rolling walker with 5" wheels;3in1 (PT)    Recommendations for Other Services       Precautions / Restrictions Precautions Precautions: Posterior Hip;Fall Precaution Booklet Issued: No Restrictions Weight Bearing Restrictions: Yes RLE Weight Bearing: Weight bearing as tolerated      Mobility  Bed Mobility Overal bed mobility: Needs Assistance Bed Mobility: Supine to Sit     Supine to sit: Min assist     General bed mobility comments: assist for sequencing and sliding B LEs off bed. Needs cues for trunk support. Pt able to push through bed to scoot out towards EOB. CUes to maintain hip precautions  Transfers Overall transfer level: Needs assistance Equipment used: Rolling walker (2 wheeled) Transfers: Sit to/from Stand Sit to Stand: Min guard         General transfer comment:  Cues given for transfers using R LE propped out and to push with B UE. ONce standing, safe technique performed  Ambulation/Gait Ambulation/Gait assistance: Min guard Ambulation Distance (Feet): 12 Feet Assistive device: Rolling walker (2 wheeled) Gait Pattern/deviations: Step-to pattern     General Gait Details: Decreased step length on R LE, however pt able to keep RW close to body. Safe sequencing and able to maintain hip precautions with mobility efforts. Minimal WBing through B UEs.  Stairs            Wheelchair Mobility    Modified Rankin (Stroke Patients Only)       Balance Overall balance assessment: History of Falls;Needs assistance Sitting-balance support: Feet supported Sitting balance-Leahy Scale: Good     Standing balance support: Bilateral upper extremity supported Standing balance-Leahy Scale: Good Standing balance comment: stood at sink to brush teeth, no LOB noted                             Pertinent Vitals/Pain Pain Assessment: 0-10 Pain Score: 1  Pain Location: R hip Pain Descriptors / Indicators: Operative site guarding Pain Intervention(s): Limited activity within patient's tolerance;Repositioned;Ice applied    Home Living Family/patient expects to be discharged to:: Private residence Living Arrangements: Spouse/significant other Available Help at Discharge: Available 24 hours/day;Family Type of Home: House Home Access: Stairs to enter Entrance Stairs-Rails: Can reach both;Left;Right Entrance Stairs-Number of Steps: 2 steps followed by landing and then additional 2 steps with B rails Home Layout: Able to live on main level with bedroom/bathroom;Two level;Laundry  or work area in [place]: Grab bars - tub/shower;Hand held shower head      Prior Function Level of Independence: Independent         Comments: Able to perform ADLs and IADLs w/o need for assistive device; reports 2 falls in past 12 months due to LOB  before cervical fusion (had L side numbness); prior to cervical fusion was working as a Nature conservation officer (desk job); spouse has been assisting with cooking/cleaning since cervical sx     Hand Dominance   Dominant Hand: Right    Extremity/Trunk Assessment   Upper Extremity Assessment Upper Extremity Assessment: Defer to OT evaluation (R LE grossly 3/5)    Lower Extremity Assessment Lower Extremity Assessment: Generalized weakness (R LE grossly 3/5; L LE grossly 5/5)       Communication   Communication: No difficulties  Cognition Arousal/Alertness: Awake/alert Behavior During Therapy: WFL for tasks assessed/performed Overall Cognitive Status: Within Functional Limits for tasks assessed                                        General Comments      Exercises Other Exercises Other Exercises: Pt/son educated in AE/DME and home/routines modications to support falls prevention and maximize functional independence with self care tasks Other Exercises: Pt performed supine ther-ex including 10 reps on R LE including ankle pumps, quad sets, and hip abd/add. ALl ther-ex performed with cga/min assist   Assessment/Plan    PT Assessment Patient needs continued PT services  PT Problem List Decreased strength;Decreased balance;Decreased mobility;Pain       PT Treatment Interventions DME instruction;Gait training;Stair training;Therapeutic exercise    PT Goals (Current goals can be found in the Care Plan section)  Acute Rehab PT Goals Patient Stated Goal: go home PT Goal Formulation: With patient Time For Goal Achievement: 04/27/16 Potential to Achieve Goals: Good    Frequency BID   Barriers to discharge        Co-evaluation               End of Session Equipment Utilized During Treatment: Gait belt Activity Tolerance: Patient tolerated treatment well Patient left: in chair;with chair alarm set;with SCD's reapplied Nurse Communication: Mobility  status;Precautions;Weight bearing status PT Visit Diagnosis: Unsteadiness on feet (R26.81);Muscle weakness (generalized) (M62.81);Pain Pain - Right/Left: Right Pain - part of body: Hip    Time: 7867-6720 PT Time Calculation (min) (ACUTE ONLY): 22 min   Charges:   PT Evaluation $PT Eval Moderate Complexity: 1 Procedure PT Treatments $Therapeutic Exercise: 8-22 mins   PT G Codes:        Greggory Stallion, PT, DPT [phone]   Colleen Donahoe 04/13/2016, 11:51 AM

## 2016-04-13 NOTE — Progress Notes (Signed)
Clinical Social Worker (CSW) received SNF consult. PT is recommending home health. RN case manager aware of above. Please reconsult if future social work needs arise. CSW signing off.   Sharlot Sturkey, LCSW [phone] 

## 2016-04-13 NOTE — NC FL2 (Signed)
[place] LEVEL OF CARE SCREENING TOOL     IDENTIFICATION  Patient Name: Morgan Lam Birthdate: [birth year] Sex: female Admission Date (Current Location): 04/12/2016  [place] and [place] Number:  Engineering geologist and Address:  [facility], [address] [place], [place] [address]      Provider Number: [id number]  Attending Physician Name and Address:  Dereck Leep, MD  Relative Name and Phone Number:       Current Level of Care: Hospital Recommended Level of Care: [place] Prior Approval Number:    Date Approved/Denied:   PASRR Number:  [id number])  Discharge Plan: SNF    Current Diagnoses: Patient Active Problem List   Diagnosis Date Noted  . S/P total hip arthroplasty 04/12/2016  . Cervical myelopathy ([place]) 03/08/2016  . Primary osteoarthritis of right hip 02/14/2016  . Menopause 07/23/2015  . Vaginal atrophy 07/23/2015    Orientation RESPIRATION BLADDER Height & Weight     Self, Time, Situation, Place  O2 (Nasal Cannula 2L/min ) Incontinent, Indwelling catheter Weight: 127 lb 5 oz (57.7 kg) Height:  5\' 7"  (170.2 cm)  BEHAVIORAL SYMPTOMS/MOOD NEUROLOGICAL BOWEL NUTRITION STATUS   (None. )  (None. ) Incontinent Diet (Diet: Regular )  AMBULATORY STATUS COMMUNICATION OF NEEDS Skin   Extensive Assist Verbally Surgical wounds (Incision: Right Hip, Neck )                       Personal Care Assistance Level of Assistance  Bathing, Feeding, Dressing Bathing Assistance: Limited assistance Feeding assistance: Independent Dressing Assistance: Limited assistance     Functional Limitations Info  Sight, Hearing, Speech Sight Info: Adequate Hearing Info: Adequate Speech Info: Adequate    SPECIAL CARE FACTORS FREQUENCY  PT (By licensed PT), OT (By licensed OT)     PT Frequency:  (5) OT Frequency:  (5)            Contractures      Additional Factors Info  Code Status, Allergies Code  Status Info:  (Full Code) Allergies Info:  (Acetaminophen-codeine, Codeine, Ivp Dye Iodinated Diagnostic Agents, Other)           Current Medications (04/13/2016):  This is the current hospital active medication list Current Facility-Administered Medications  Medication Dose Route Frequency Provider Last Rate Last Dose  . 0.9 %  sodium chloride infusion   Intravenous Continuous Dereck Leep, MD 100 mL/hr at 04/13/16 9381    . acetaminophen (OFIRMEV) IV 1,000 mg  1,000 mg Intravenous Q6H Dereck Leep, MD   1,000 mg at 04/13/16 8299  . acetaminophen (TYLENOL) tablet 650 mg  650 mg Oral Q6H PRN Dereck Leep, MD       Or  . acetaminophen (TYLENOL) suppository 650 mg  650 mg Rectal Q6H PRN Dereck Leep, MD      . alum & mag hydroxide-simeth (MAALOX/MYLANTA) 200-200-20 MG/5ML suspension 30 mL  30 mL Oral Q4H PRN Dereck Leep, MD      . bisacodyl (DULCOLAX) suppository 10 mg  10 mg Rectal Daily PRN Dereck Leep, MD      . calcium-vitamin D (OSCAL WITH D) 500-200 MG-UNIT per tablet 1 tablet  1 tablet Oral Daily Dereck Leep, MD   1 tablet at 04/13/16 0900  . ceFAZolin (ANCEF) IVPB 2 g/50 mL premix  2 g Intravenous Q6H Dereck Leep, MD   2 g at 04/13/16 3716  . celecoxib (CELEBREX) capsule  200 mg  200 mg Oral Q12H Dereck Leep, MD   200 mg at 04/13/16 0900  . diphenhydrAMINE (BENADRYL) 12.5 MG/5ML elixir 12.5-25 mg  12.5-25 mg Oral Q4H PRN Dereck Leep, MD      . enoxaparin (LOVENOX) injection 30 mg  30 mg Subcutaneous Q12H Dereck Leep, MD   30 mg at 04/13/16 0858  . ferrous sulfate tablet 325 mg  325 mg Oral BID WC Dereck Leep, MD   325 mg at 04/13/16 0858  . magnesium hydroxide (MILK OF MAGNESIA) suspension 30 mL  30 mL Oral Daily PRN Dereck Leep, MD      . menthol-cetylpyridinium (CEPACOL) lozenge 3 mg  1 lozenge Oral PRN Dereck Leep, MD       Or  . phenol (CHLORASEPTIC) mouth spray 1 spray  1 spray Mouth/Throat PRN Dereck Leep, MD      . metoCLOPramide (REGLAN)  tablet 10 mg  10 mg Oral TID AC & HS Dereck Leep, MD   10 mg at 04/13/16 0858  . morphine 2 MG/ML injection 2 mg  2 mg Intravenous Q2H PRN Dereck Leep, MD   2 mg at 04/12/16 2034  . multivitamin with minerals tablet 1 tablet  1 tablet Oral Daily Dereck Leep, MD   1 tablet at 04/13/16 0900  . ondansetron (ZOFRAN) tablet 4 mg  4 mg Oral Q6H PRN Dereck Leep, MD       Or  . ondansetron (ZOFRAN) injection 4 mg  4 mg Intravenous Q6H PRN Dereck Leep, MD   4 mg at 04/12/16 1750  . oxyCODONE (Oxy IR/ROXICODONE) immediate release tablet 5-10 mg  5-10 mg Oral Q4H PRN Dereck Leep, MD   5 mg at 04/13/16 0631  . pantoprazole (PROTONIX) EC tablet 40 mg  40 mg Oral BID Dereck Leep, MD   40 mg at 04/13/16 0900  . potassium chloride SA (K-DUR,KLOR-CON) CR tablet 20 mEq  20 mEq Oral TID Dereck Leep, MD   20 mEq at 04/13/16 0900  . senna-docusate (Senokot-S) tablet 1 tablet  1 tablet Oral BID Dereck Leep, MD   1 tablet at 04/13/16 0900  . sodium phosphate (FLEET) 7-19 GM/118ML enema 1 enema  1 enema Rectal Once PRN Dereck Leep, MD      . traMADol Veatrice Bourbon) tablet 50-100 mg  50-100 mg Oral Q4H PRN Dereck Leep, MD         Discharge Medications: Please see discharge summary for a list of discharge medications.  Relevant Imaging Results:  Relevant Lab Results:   Additional Information  (SSN: [id number])  Danie Chandler, Student-Social Work

## 2016-04-13 NOTE — Progress Notes (Signed)
qPhysical Therapy Treatment Patient Details Name: Morgan Lam MRN: [id number] DOB: 1955-03-24 Today's Date: 04/13/2016    History of Present Illness Pt is a 61 yo female, s/p R THA (posterior hip precautions, WBAT) POD#1 for PT evaluation. PMHx includes DDD, HTN, HLD, cervical myelopathy, diverticulitis, recent Anterior decompression/disectomy and fusion of C3/4, C4/5, C5/6 vertebrae on 03/08/16.     PT Comments    Pt is making good progress towards goals, limited by nausea this date. Pt agreeable to work with therapy and able to improve gait distance. Able to recite 2/3 hip precautions. Gave and explained written HEP. Will continue to progress.   Follow Up Recommendations  Home health PT     Equipment Recommendations  Rolling walker with 5" wheels;3in1 (PT)    Recommendations for Other Services       Precautions / Restrictions Precautions Precautions: Posterior Hip;Fall Precaution Booklet Issued: Yes (comment) Restrictions Weight Bearing Restrictions: Yes RLE Weight Bearing: Weight bearing as tolerated    Mobility  Bed Mobility Overal bed mobility: Needs Assistance Bed Mobility: Supine to Sit     Supine to sit: Min guard     General bed mobility comments: improved technique for mobility with decreased cues required. 1 cue required for maintaining hip precautions during transitions. Once seated at EOB, pt very nauseous.  Transfers Overall transfer level: Needs assistance Equipment used: Rolling walker (2 wheeled) Transfers: Sit to/from Stand Sit to Stand: Min guard         General transfer comment: Cues given for transfers using R LE propped out and to push with B UE. ONce standing, safe technique performed  Ambulation/Gait Ambulation/Gait assistance: Min guard Ambulation Distance (Feet): 60 Feet Assistive device: Rolling walker (2 wheeled) Gait Pattern/deviations: Step-through pattern     General Gait Details: Pt able to progress from step-to gait to  reciprocal, although inconsistent. Pt needs cues to keep RW moving and pt demonstrates slow gait speed. Pt needs one standing rest break secondary to nausea   Stairs            Wheelchair Mobility    Modified Rankin (Stroke Patients Only)       Balance                                            Cognition Arousal/Alertness: Awake/alert Behavior During Therapy: WFL for tasks assessed/performed Overall Cognitive Status: Within Functional Limits for tasks assessed                                        Exercises Other Exercises Other Exercises: supine ther-ex performed on R LE including ankle pumps, glut sets, quad sets, hip ab/ad, and SAQ. All ther-ex performed x 10 reps with min assist Other Exercises: went to bathroom in room with cga. Safe technique using RW. Cues given for maintaining hip precautions.    General Comments        Pertinent Vitals/Pain Pain Assessment: 0-10 Pain Score: 1  Pain Location: R hip Pain Descriptors / Indicators: Operative site guarding Pain Intervention(s): Limited activity within patient's tolerance    Home Living                      Prior Function  PT Goals (current goals can now be found in the care plan section) Acute Rehab PT Goals Patient Stated Goal: go home PT Goal Formulation: With patient Time For Goal Achievement: 04/27/16 Potential to Achieve Goals: Good Progress towards PT goals: Progressing toward goals    Frequency    BID      PT Plan Current plan remains appropriate    Co-evaluation             End of Session Equipment Utilized During Treatment: Gait belt Activity Tolerance: Patient tolerated treatment well Patient left: in bed;with bed alarm set Nurse Communication: Mobility status;Precautions;Weight bearing status PT Visit Diagnosis: Unsteadiness on feet (R26.81);Muscle weakness (generalized) (M62.81);Pain Pain - Right/Left: Right Pain -  part of body: Hip     Time: 1340-1405 PT Time Calculation (min) (ACUTE ONLY): 25 min  Charges:  $Gait Training: 8-22 mins $Therapeutic Exercise: 8-22 mins                    G Codes:       Greggory Stallion, PT, DPT [phone]    Khyle Goodell 04/13/2016, 4:49 PM

## 2016-04-13 NOTE — Evaluation (Signed)
Occupational Therapy Evaluation Patient Details Name: Morgan Lam MRN: [id number] DOB: 06/16/1955 Today's Date: 04/13/2016    History of Present Illness Pt is a 61 yo female, s/p R THA (posterior hip precautions, WBAT) POD#1 for OT evaluation. PMHx includes DDD, HTN, HLD, cervical myelopathy, diverticulitis, recent Anterior decompression/disectomy and fusion of C3/4, C4/5, C5/6 vertebrae on 03/08/16.    Clinical Impression   Pt seen for OT evaluation this date. Pt is 61 year old female s/p R THR(posterior approach with precautions, WBAT) who lives at home with her husband. Pt was recovering well at home from recent cervical fusion in February, was independent in all ADL prior to surgery, and is eager to return to PLOF.  Pt is currently limited in functional ADL due to decreased ROM/strength, decreased activity tolerance/knowledge of precautions and knowledge of AE for self care tasks. Pt able to recall 1/3 posterior hip precautions without cueing and required occasional verbal cues during functional tasks to maintain precautions. Pt requires min guard to min assist for LB dressing and bathing skills and would benefit from continued skilled OT services for education in assistive devices, functional mobility, and education in recommendations for home modifications to increase safety and prevent falls.  Pt is a good candidate for home health OT to continue rehabilitation pending pt's progress with negotiating stairs with PT.      Follow Up Recommendations  Home health OT (pending stairs with PT)   Equipment Recommendations  Tub/shower seat;3 in 1 bedside commode    Recommendations for Other Services       Precautions / Restrictions Precautions Precautions: Posterior Hip;Fall Precaution Booklet Issued: No Restrictions Weight Bearing Restrictions: Yes RLE Weight Bearing: Weight bearing as tolerated      Mobility Bed Mobility               General bed mobility comments: did not  assess, pt up in recliner at start of session  Transfers Overall transfer level: Needs assistance Equipment used: Rolling walker (2 wheeled) Transfers: Sit to/from Stand Sit to Stand: Min guard         General transfer comment: with verbal cues for RLE and hand placement to maximize safety and maintain precautions    Balance Overall balance assessment: History of Falls;Needs assistance Sitting-balance support: Feet supported;No upper extremity supported Sitting balance-Leahy Scale: Good     Standing balance support: Single extremity supported;During functional activity Standing balance-Leahy Scale: Good Standing balance comment: stood at sink to brush teeth, no LOB noted                           ADL either performed or assessed with clinical judgement   ADL Overall ADL's : Needs assistance/impaired Eating/Feeding: Sitting;Set up   Grooming: Standing;Min guard;Wash/dry face;Wash/dry hands;Oral care   Upper Body Bathing: Sitting;Set up   Lower Body Bathing: Sit to/from stand;Minimal assistance   Upper Body Dressing : Sitting;Set up   Lower Body Dressing: Sit to/from stand;Sitting/lateral leans;Adhering to hip precautions;Min guard Lower Body Dressing Details (indicate cue type and reason): pt educated in use of AE for LB dressing tasks, pt able to return demonstrate technique with min guard, while adhering to hip precautions; would benefit from additional practice to support recall of precautions and carryover of learned techniques Toilet Transfer: Min guard;Comfort height toilet;Cueing for safety;RW Toilet Transfer Details (indicate cue type and reason): pt performed toilet transfer to comfort height toilet ambulating with RW, min guard and verbal cues for hand placement  and RLE to maintain hip precautions Toileting- Clothing Manipulation and Hygiene: Sitting/lateral lean;Modified independent       Functional mobility during ADLs: Min guard;Rolling  walker;Cueing for safety General ADL Comments: Pt generally min guard-min A for LB ADL for safety with occasional verbal cues for maintaining hip precautions     Vision Baseline Vision/History: Wears glasses Wears Glasses: At all times Patient Visual Report: No change from baseline Vision Assessment?: No apparent visual deficits     Perception     Praxis Praxis Praxis tested?: Within functional limits    Pertinent Vitals/Pain Pain Assessment: 0-10 Pain Score: 1  Pain Location: R hip at rest, increasing to 2/10 with movement Pain Descriptors / Indicators: Aching Pain Intervention(s): Limited activity within patient's tolerance;Monitored during session;Ice applied;Repositioned;Premedicated before session     Hand Dominance Right   Extremity/Trunk Assessment Upper Extremity Assessment Upper Extremity Assessment: Overall WFL for tasks assessed   Lower Extremity Assessment Lower Extremity Assessment: Defer to PT evaluation;RLE deficits/detail       Communication Communication Communication: No difficulties   Cognition Arousal/Alertness: Awake/alert Behavior During Therapy: WFL for tasks assessed/performed Overall Cognitive Status: Within Functional Limits for tasks assessed                                     General Comments       Exercises Other Exercises Other Exercises: Pt/son educated in AE/DME and home/routines modications to support falls prevention and maximize functional independence with self care tasks   Shoulder Instructions      Home Living Family/patient expects to be discharged to:: Private residence Living Arrangements: Spouse/significant other;Children Available Help at Discharge: Available 24 hours/day;Family Type of Home: House Home Access: Stairs to enter [employer] of Steps: 8 STE from back (L rail), 2 STE from front bilat rail); plans to use front entrance Entrance Stairs-Rails: Can reach both;Left;Right Home  Layout: Able to live on main level with bedroom/bathroom;Two level;Laundry or work area in basement     [employer] Shower/Tub: Corporate investment banker: Programmer, systems: Yes How Accessible: Accessible via walker Home Equipment: Grab bars - tub/shower;Hand held shower head          Prior Functioning/Environment          Comments: Able to perform ADLs and IADLs w/o need for assistive device; reports 2 falls in past 12 months due to LOB before cervical fusion (had L side numbness); prior to cervical fusion was working as a Nature conservation officer (desk job); spouse has been assisting with cooking/cleaning since cervical sx        OT Problem List: Decreased strength;Decreased coordination;Decreased range of motion;Decreased activity tolerance;Decreased safety awareness;Decreased knowledge of use of DME or AE      OT Treatment/Interventions: Self-care/ADL training;Therapeutic exercise;Energy conservation;DME and/or AE instruction;Patient/family education    OT Goals(Current goals can be found in the care plan section) Acute Rehab OT Goals Patient Stated Goal: go home OT Goal Formulation: With patient/family Time For Goal Achievement: 04/20/16 Potential to Achieve Goals: Good  OT Frequency: Min 1X/week   Barriers to D/C:            Co-evaluation              End of Session Equipment Utilized During Treatment: Gait belt;Rolling walker  Activity Tolerance: Patient tolerated treatment well Patient left: in chair;with call bell/phone within reach;with chair alarm set;with family/visitor present;with SCD's reapplied  OT  Visit Diagnosis: Other abnormalities of gait and mobility (R26.89);History of falling (Z91.81);Muscle weakness (generalized) (M62.81)                Time: 4458-4835 OT Time Calculation (min): 55 min Charges:  OT General Charges $OT Visit: 1 Procedure OT Evaluation $OT Eval Low Complexity: 1 Procedure OT Treatments $Self  Care/Home Management : 38-52 mins G-Codes:     Jeni Salles, MPH, MS, OTR/L ascom [phone] 04/13/16, 11:11 AM

## 2016-04-13 NOTE — Discharge Summary (Signed)
Physician Discharge Summary  Patient ID: Morgan Lam MRN: [id number] DOB/AGE: 63/15/1957 63 y.o.  Admit date: 04/12/2016 Discharge date: 04/14/2016  Admission Diagnoses:  PRIMARY OSTEOARTHRITIS OF RIGHT HIP   Discharge Diagnoses: Patient Active Problem List   Diagnosis Date Noted  . S/P total hip arthroplasty 04/12/2016  . Cervical myelopathy ([place]) 03/08/2016  . Primary osteoarthritis of right hip 02/14/2016  . Menopause 07/23/2015  . Vaginal atrophy 07/23/2015    Past Medical History:  Diagnosis Date  . Cortical cataract   . DDD (degenerative disc disease), cervical   . DDD (degenerative disc disease), cervical   . Diverticulitis   . Hyperlipemia   . Migraines    history of migraines  . Pituitary adenoma ([place])   . PONV (postoperative nausea and vomiting)   . Vitamin D deficiency      Transfusion: No transfusions during this admission   Consultants (if any):  none  Discharged Condition: Improved  Hospital Course: Morgan Lam is an 61 y.o. female who was admitted 04/12/2016 with a diagnosis of degenerative arthrosis right hip and went to the operating room on 04/12/2016 and underwent the above named procedures.    Surgeries:Procedure(s): TOTAL HIP ARTHROPLASTY on 04/12/2016  PRE-OPERATIVE DIAGNOSIS: Degenerative arthrosis of the right hip, primary  POST-OPERATIVE DIAGNOSIS:  Same  PROCEDURE:  Right total hip arthroplasty  SURGEON:  Marciano Sequin. M.D.  ASSISTANT:  Vance Peper, PA (present and scrubbed throughout the case, critical for assistance with exposure, retraction, instrumentation, and closure)  ANESTHESIA: spinal  ESTIMATED BLOOD LOSS: 50 mL  FLUIDS REPLACED: 1500 mL of crystalloid  DRAINS: 2 medium drains to a Hemovac reservoir  IMPLANTS UTILIZED: DePuy 13.5 mm small stature AML femoral stem, 52 mm OD Pinnacle 100 acetabular component, +4 mm 10 Pinnacle Marathon polyethylene insert, and a 36 mm M-SPEC +1.5 mm hip  ball  INDICATIONS FOR SURGERY: Morgan Lam is a 61 y.o. year old female with a long history of progressive hip and groin  pain. X-rays demonstrated severe degenerative changes. The patient had not seen any significant improvement despite conservative nonsurgical intervention. After discussion of the risks and benefits of surgical intervention, the patient expressed understanding of the risks benefits and agree with plans for total hip arthroplasty.   The risks, benefits, and alternatives were discussed at length including but not limited to the risks of infection, bleeding, nerve injury, stiffness, blood clots, the need for revision surgery, limb length inequality, dislocation, cardiopulmonary complications, among others, and they were willing to proceed. Patient tolerated the surgery well. No complications .Patient was taken to PACU where she was stabilized and then transferred to the orthopedic floor.  Patient started on Lovenox 30 mg q 12 hrs. Foot pumps applied bilaterally at 80 mm hgb. Heels elevated off bed with rolled towels. No evidence of DVT. Calves non tender. Negative Homan. Physical therapy started on day #1 for gait training and transfer with OT starting on  day #1 for ADL and assisted devices. Patient has done well with therapy. Ambulated greater than 200 feet upon being discharged. Was able to ascend and descend 4 steps safely and independently  Patient's IV and Foley were discontinued on day #1 with Hemovac being discontinued on day #2. Dressing was changed on day 2 prior to patient being discharged   She was given perioperative antibiotics:  Anti-infectives    Start     Dose/Rate Route Frequency Ordered Stop   04/12/16 1800  ceFAZolin (ANCEF) IVPB 2 g/50 mL premix  2 g 100 mL/hr over 30 Minutes Intravenous Every 6 hours 04/12/16 1741 04/13/16 1759   04/12/16 1745  ceFAZolin (ANCEF) IVPB 2g/100 mL premix  Status:  Discontinued     2 g 200 mL/hr over 30 Minutes  Intravenous Every 6 hours 04/12/16 1733 04/12/16 1741   04/12/16 1024  ceFAZolin (ANCEF) 2-4 GM/100ML-% IVPB    Comments:  KENNEDY, ASHLEY: cabinet override      04/12/16 1024 04/12/16 2229   04/12/16 0600  ceFAZolin (ANCEF) IVPB 2g/100 mL premix  Status:  Discontinued     2 g 200 mL/hr over 30 Minutes Intravenous On call to O.R. 04/11/16 2159 04/12/16 1033    .  She was Was fitted with AV 1 compression foot pump devices, instructed on heel pumps, early ambulation, and TED stockings bilaterally for DVT prophylaxis.  She benefited maximally from the hospital stay and there were no complications.    Recent vital signs:  Vitals:   04/12/16 2315 04/13/16 0433  BP: (!) 159/59 (!) 151/60  Pulse: 76 85  Resp: 16 18  Temp: 99.5 F (37.5 C) 98.3 F (36.8 C)    Recent laboratory studies:  Lab Results  Component Value Date   HGB 9.8 (L) 04/13/2016   HGB 11.6 (L) 03/31/2016   HGB 12.6 03/08/2016   Lab Results  Component Value Date   WBC 12.4 (H) 04/13/2016   PLT 179 04/13/2016   Lab Results  Component Value Date   INR 1.03 03/31/2016   Lab Results  Component Value Date   NA 137 04/13/2016   K 3.3 (L) 04/13/2016   CL 105 04/13/2016   CO2 25 04/13/2016   BUN 6 04/13/2016   CREATININE 0.68 04/13/2016   GLUCOSE 97 04/13/2016    Discharge Medications:   Allergies as of 04/14/2016      Reactions   Acetaminophen-codeine Nausea Only   Tylenol #3   Codeine Nausea Only   Ivp Dye [iodinated Diagnostic Agents] Hives   Topical betadine pt is not allergic to.   Other    Anesthesia-nausea/vomiting      Medication List    TAKE these medications   aspirin 81 MG chewable tablet Chew 81 mg by mouth daily.   CALTRATE 600+D 600-800 MG-UNIT Tabs Generic drug:  Calcium Carb-Cholecalciferol Take 1 tablet by mouth daily.   enoxaparin 30 MG/0.3ML injection Commonly known as:  LOVENOX Inject 0.4 mLs (40 mg total) into the skin daily.   multivitamin with minerals Tabs tablet Take  1 tablet by mouth daily. Centrum silver   oxyCODONE 5 MG immediate release tablet Commonly known as:  Oxy IR/ROXICODONE Take 1-2 tablets (5-10 mg total) by mouth every 4 (four) hours as needed for severe pain.   traMADol 50 MG tablet Commonly known as:  ULTRAM Take 1-2 tablets (50-100 mg total) by mouth every 4 (four) hours as needed for moderate pain.            Durable Medical Equipment        Start     Ordered   04/12/16 1734  DME Walker rolling  Once    Question:  Patient needs a walker to treat with the following condition  Answer:  S/P total hip arthroplasty   04/12/16 1733   04/12/16 1734  DME Bedside commode  Once    Question:  Patient needs a bedside commode to treat with the following condition  Answer:  S/P total hip arthroplasty   04/12/16 1733      Diagnostic  Studies: Dg Hip Port Unilat With Pelvis 1v Right  Result Date: 04/12/2016 CLINICAL DATA:  Status post right hip replacement today. EXAM: DG HIP (WITH OR WITHOUT PELVIS) 1V PORT RIGHT COMPARISON:  None. FINDINGS: Total right hip arthroplasty is in place. The device is located. No fracture. Surgical drain and staples are noted. IMPRESSION: Status post right total hip replacement.  No acute abnormality. Electronically Signed   By: Inge Rise M.D.   On: 04/12/2016 15:43    Disposition: 01-Home or Self Care    Follow-up Information    Dereck Leep, MD On 05/25/2016.   Specialty:  Orthopedic Surgery Why:  at 9:45am Contact information: [place] [address] [phone]            Signed: Watt Climes 04/13/2016, 7:42 AM

## 2016-04-13 NOTE — Discharge Instructions (Signed)

## 2016-04-14 ENCOUNTER — Encounter: Payer: Self-pay | Admitting: Internal Medicine

## 2016-04-14 LAB — CBC
HCT: 27.9 % — ABNORMAL LOW (ref 35.0–47.0)
Hemoglobin: 9.3 g/dL — ABNORMAL LOW (ref 12.0–16.0)
MCH: 28.7 pg (ref 26.0–34.0)
MCHC: 33.3 g/dL (ref 32.0–36.0)
MCV: 86.2 fL (ref 80.0–100.0)
PLATELETS: 165 10*3/uL (ref 150–440)
RBC: 3.24 MIL/uL — ABNORMAL LOW (ref 3.80–5.20)
RDW: 14.1 % (ref 11.5–14.5)
WBC: 12.8 10*3/uL — AB (ref 3.6–11.0)

## 2016-04-14 LAB — BASIC METABOLIC PANEL
Anion gap: 7 (ref 5–15)
BUN: 6 mg/dL (ref 6–20)
CALCIUM: 8.3 mg/dL — AB (ref 8.9–10.3)
CO2: 25 mmol/L (ref 22–32)
CREATININE: 0.71 mg/dL (ref 0.44–1.00)
Chloride: 106 mmol/L (ref 101–111)
GFR calc Af Amer: >60 mL/min (ref >60)
Glucose, Bld: 81 mg/dL (ref 65–99)
Potassium: 3.2 mmol/L — ABNORMAL LOW (ref 3.5–5.1)
Sodium: 138 mmol/L (ref 135–145)

## 2016-04-14 LAB — SURGICAL PATHOLOGY

## 2016-04-14 MED ORDER — LACTULOSE 10 GM/15ML PO SOLN
10.0000 g | Freq: Two times a day (BID) | ORAL | Status: DC | PRN
  Administered 2016-04-14: 10 g via ORAL
  Filled 2016-04-14: qty 30

## 2016-04-14 MED ORDER — POTASSIUM CHLORIDE 20 MEQ PO PACK
40.0000 meq | PACK | Freq: Once | ORAL | Status: AC
  Administered 2016-04-14: 40 meq via ORAL

## 2016-04-14 MED ORDER — ENOXAPARIN SODIUM 30 MG/0.3ML ~~LOC~~ SOLN: 40.0000 mg | SUBCUTANEOUS | 0 refills | Status: DC

## 2016-04-14 MED ORDER — TRAMADOL HCL 50 MG PO TABS: 50.0000 mg | ORAL_TABLET | ORAL | 0 refills | Status: DC | PRN

## 2016-04-14 MED ORDER — OXYCODONE HCL 5 MG PO TABS: 5.0000 mg | ORAL_TABLET | ORAL | 0 refills | Status: DC | PRN

## 2016-04-14 MED ORDER — METOPROLOL TARTRATE 25 MG PO TABS: 25.0000 mg | ORAL_TABLET | Freq: Two times a day (BID) | ORAL | 0 refills | Status: DC

## 2016-04-14 MED ORDER — METOPROLOL TARTRATE 25 MG PO TABS
25.0000 mg | ORAL_TABLET | ORAL | Status: AC
  Administered 2016-04-14: 25 mg via ORAL
  Filled 2016-04-14 (×3): qty 1

## 2016-04-14 MED ORDER — POTASSIUM CHLORIDE 20 MEQ PO PACK
20.0000 meq | PACK | Freq: Three times a day (TID) | ORAL | Status: DC
  Administered 2016-04-14: 20 meq via ORAL
  Filled 2016-04-14 (×2): qty 1

## 2016-04-14 NOTE — Care Management Note (Signed)
Case Management Note  Patient Details  Name: Morgan Lam MRN: [id number] Date of Birth: May 17, 1955  Subjective/Objective:  Discharging today                 Action/Plan:  [facility] notified of discharge. Cost of Lovenox is $ 30.00. Notified spouse and he denies issues paying for medication. Informed him that DME would be delivered this morning.   Expected Discharge Date:  04/14/16               Expected Discharge Plan:  [place]  In-House Referral:     Discharge planning Services  CM Consult  Post Acute Care Choice:  Home Health Choice offered to:  Patient  DME Arranged:  Walker rolling, Bedside commode DME Agency:  [place]:  PT [place]:  [facility] (now [facility])  Status of Service:  Completed, signed off  If discussed at [employer] of Stay Meetings, dates discussed:    Additional Comments:  Jolly Mango, RN 04/14/2016, 9:02 AM

## 2016-04-14 NOTE — Progress Notes (Signed)
qPhysical Therapy Treatment Patient Details Name: Morgan Lam MRN: [id number] DOB: 12-14-1955 Today's Date: 04/14/2016    History of Present Illness Pt is a 61 yo female, s/p R THA (posterior hip precautions, WBAT). PMHx includes DDD, HTN, HLD, cervical myelopathy, diverticulitis, recent Anterior decompression/disectomy and fusion of C3/4, C4/5, C5/6 vertebrae on 03/08/16.     PT Comments    Patient able to complete stair training (up/down 4 steps with bilat rails and up/down curb) required to ensure safe discharge home, requiring no greater than cga assist to complete.  Good tolerance for R LE WBing in modified SLS; good stability, no buckling or LOB.  Voices comfort with stair performance upon discharge. Of note, patient's HR continues to remain in 120s at rest, up to 130s with activity; patient without reports of symptoms, though baseline HR since admission appears to be in 70s.  Physician ([place]) and RN Fanny Skates) informed/aware.    Follow Up Recommendations  Home health PT     Equipment Recommendations  Rolling walker with 5" wheels    Recommendations for Other Services       Precautions / Restrictions Precautions Precautions: Posterior Hip;Fall Precaution Booklet Issued: No Restrictions Weight Bearing Restrictions: Yes RLE Weight Bearing: Weight bearing as tolerated    Mobility  Bed Mobility               General bed mobility comments: seated in recliner beginning/end of treatment session  Transfers Overall transfer level: Needs assistance Equipment used: Rolling walker (2 wheeled) Transfers: Sit to/from Stand Sit to Stand: Supervision         General transfer comment: very minimal use of R LE with movement transition; requires bilat UE support and heavy weight shift to L LE to complete  Ambulation/Gait                 Stairs Stairs: Yes   Stair Management: Two rails Number of Stairs: 4 General stair comments: also completed curb management  with RW, cga.  min cuing for sequence/technique with all stair trials; good R LE stability and WBing; no buckling or LOB.  Patient comfortable with performance.  Wheelchair Mobility    Modified Rankin (Stroke Patients Only)       Balance                                            Cognition Arousal/Alertness: Awake/alert Behavior During Therapy: WFL for tasks assessed/performed Overall Cognitive Status: Within Functional Limits for tasks assessed                                        Exercises Other Exercises Other Exercises: Verbally reviewed technique for car transfer and functional implications of THPs (with regards to seating choices, how to know what is too low); patient voiced understanding of all infomration.    General Comments        Pertinent Vitals/Pain Pain Assessment: Faces Faces Pain Scale: Hurts a little bit Pain Location: R hip Pain Descriptors / Indicators: Operative site guarding Pain Intervention(s): Limited activity within patient's tolerance;Monitored during session;Repositioned    Home Living                      Prior Function  PT Goals (current goals can now be found in the care plan section) Acute Rehab PT Goals Patient Stated Goal: go home PT Goal Formulation: With patient Time For Goal Achievement: 04/27/16 Potential to Achieve Goals: Good Progress towards PT goals: Progressing toward goals    Frequency    BID      PT Plan Current plan remains appropriate    Co-evaluation             End of Session Equipment Utilized During Treatment: Gait belt Activity Tolerance: Patient tolerated treatment well Patient left: in chair;with call bell/phone within reach;with chair alarm set Nurse Communication: Mobility status PT Visit Diagnosis: Unsteadiness on feet (R26.81);Muscle weakness (generalized) (M62.81);Pain Pain - Right/Left: Right Pain - part of body: Hip     Time:  1444-5848 PT Time Calculation (min) (ACUTE ONLY): 19 min  Charges:  $Gait Training: 8-22 mins                    G Codes:     Madalyn Legner H. Owens Shark, PT, DPT, NCS 04/14/16, 3:57 PM [phone]

## 2016-04-14 NOTE — Progress Notes (Signed)
   Subjective: 2 Days Post-Op Procedure(s) (LRB): TOTAL HIP ARTHROPLASTY (Right) Patient reports pain as mild.   Patient is well, and has had no acute complaints or problems Continue with physical therapy today.  Plan is to go Home after hospital stay. no nausea and no vomiting but was nauseated yesterday. States that the medication given to her for nausea was not working. Patient denies any chest pains or shortness of breath. Objective: Vital signs in last 24 hours: Temp:  [98.6 F (37 C)-98.7 F (37.1 C)] 98.6 F (37 C) (04/03 2352) Pulse Rate:  [72-77] 77 (04/03 2352) Resp:  [18] 18 (04/03 2352) BP: (155-158)/(58-69) 155/69 (04/03 2352) SpO2:  [99 %-100 %] 99 % (04/03 2352) well approximated incision Heels are non tender and elevated off the bed using rolled towels Intake/Output from previous day: 04/03 0701 - 04/04 0700 In: 2315 [P.O.:120; I.V.:2195] Out: 335 [Drains:335] Intake/Output this shift: Total I/O In: 1348.3 [I.V.:1348.3] Out: 335 [Drains:335]   Recent Labs  04/13/16 0516 04/14/16 0526  HGB 9.8* 9.3*    Recent Labs  04/13/16 0516 04/14/16 0526  WBC 12.4* 12.8*  RBC 3.45* 3.24*  HCT 29.1* 27.9*  PLT 179 165    Recent Labs  04/13/16 0516 04/14/16 0526  NA 137 138  K 3.3* 3.2*  CL 105 106  CO2 25 25  BUN 6 6  CREATININE 0.68 0.71  GLUCOSE 97 81  CALCIUM 8.4* 8.3*   No results for input(s): LABPT, INR in the last 72 hours.  EXAM General - Patient is Alert, Appropriate and Oriented Extremity - Neurologically intact Neurovascular intact Sensation intact distally Intact pulses distally Dorsiflexion/Plantar flexion intact No cellulitis present Compartment soft Dressing - scant drainage Motor Function - intact, moving foot and toes well on exam.    Past Medical History:  Diagnosis Date  . Cortical cataract   . DDD (degenerative disc disease), cervical   . DDD (degenerative disc disease), cervical   . Diverticulitis   .  Hyperlipemia   . Migraines    history of migraines  . Pituitary adenoma ([place])   . PONV (postoperative nausea and vomiting)   . Vitamin D deficiency     Assessment/Plan: 2 Days Post-Op Procedure(s) (LRB): TOTAL HIP ARTHROPLASTY (Right) Active Problems:   S/P total hip arthroplasty  Estimated body mass index is 19.94 kg/m as calculated from the following:   Height as of this encounter: 5\' 7"  (1.702 m).   Weight as of this encounter: 57.7 kg (127 lb 5 oz). Up with therapy Discharge home with home health  Labs: Were reviewed. Potassium 3.2. Supplemented with Klor-Con 20 mEq 3 times a day DVT Prophylaxis - Lovenox, Foot Pumps and TED hose Weight-Bearing as tolerated to right leg Hemovac discontinued on today's visit Patient may be discharged to home once she does the lap around the nurse's desk, steps and has a bowel movement. Lactulose added Please give the patient 2 extra honeycomb dressings to take home with her.  Jillyn Ledger. [facility] [place] 04/14/2016, 6:56 AM

## 2016-04-14 NOTE — Progress Notes (Signed)
Patient is alert and oriented and able to verbalize needs. States no pain at this time. Husband at bedside. Discharge instructions gone over with patient at this time. Printed AVS and hard scripts given to patient. Patient administered Lovenox injection this morning and states she feels comfortable with these at home. Patient and husband verbalize understanding of all follow up care. PIV removed. VSS stable. Husband to transport home.

## 2016-04-14 NOTE — Progress Notes (Signed)
Pt HR elevated, Pt is asymptomatic.  MD notified and order for 12 lead EKG obtained. EKG showed ST with abnormal T wave, MD ordered STAT dose of Potassium and Metoprolol. Medications given at this time and will reassess BP and pulse.

## 2016-04-14 NOTE — Progress Notes (Signed)
Occupational Therapy Treatment Patient Details Name: Morgan Lam MRN: [id number] DOB: 07-Jan-1956 Today's Date: 04/14/2016    History of present illness Pt is a 61 yo female, s/p R THA (posterior hip precautions, WBAT). PMHx includes DDD, HTN, HLD, cervical myelopathy, diverticulitis, recent Anterior decompression/disectomy and fusion of C3/4, C4/5, C5/6 vertebrae on 03/08/16.    OT comments  Pt progressing towards goals, eager to return home today after being cleared by PT for stairs. Resting HR 123-124 seated. Per RN request, OT called PT to request PT session to clear stairs due to pending discharge. Pt provided with ECS handout and educated in home/routines modifications to support safety and functional independence in the home. Pt verbalized understanding. Pt able to recall 2/3 hip precautions and with 1 verbal cue, able to recall the 3rd precaution. Pt and OT discussed bathing and sleeping positioning to maintain hip precautions. Discharge plans remain appropriate.    Follow Up Recommendations  Home health OT    Equipment Recommendations  Tub/shower seat;3 in 1 bedside commode    Recommendations for Other Services      Precautions / Restrictions Precautions Precautions: Posterior Hip;Fall Precaution Booklet Issued: No Restrictions Weight Bearing Restrictions: Yes RLE Weight Bearing: Weight bearing as tolerated       Mobility Bed Mobility               General bed mobility comments: Not tested; pt up in chair  Transfers    Balance                              ADL either performed or assessed with clinical judgement   ADL                                               Vision       Perception     Praxis      Cognition Arousal/Alertness: Awake/alert Behavior During Therapy: WFL for tasks assessed/performed Overall Cognitive Status: Within Functional Limits for tasks assessed                                           Exercises  Other Exercises Other Exercises: Pt educated in energy conservation strategies with handout provided to support functional independence and safety at home. Pt verbalizes understanding. Discussed set up and safety for showering once pt is cleared to get surgical site wet.   Shoulder Instructions       General Comments      Pertinent Vitals/ Pain       Pain Assessment: 0-10 Pain Score: 1  Pain Location: R hip Pain Intervention(s): Monitored during session;Premedicated before session  Home Living                                          Prior Functioning/Environment              Frequency  Min 1X/week        Progress Toward Goals  OT Goals(current goals can now be found in the care plan section)  Progress towards OT goals: Progressing toward goals  Acute Rehab OT  Goals Patient Stated Goal: go home OT Goal Formulation: With patient/family Time For Goal Achievement: 04/20/16 Potential to Achieve Goals: Good  Plan Discharge plan remains appropriate    Co-evaluation                 End of Session    OT Visit Diagnosis: Other abnormalities of gait and mobility (R26.89);History of falling (Z91.81);Muscle weakness (generalized) (M62.81)   Activity Tolerance Patient tolerated treatment well   Patient Left in chair;with call bell/phone within reach;with chair alarm set   Nurse Communication          Time: 7741-4239 OT Time Calculation (min): 16 min  Charges: OT General Charges $OT Visit: 1 Procedure OT Treatments $Self Care/Home Management : 8-22 mins  Jeni Salles, MPH, MS, OTR/L ascom [phone] 04/14/16, 2:33 PM

## 2016-04-14 NOTE — Consult Note (Signed)
[facility] [place] NAME: Morgan Lam    MR#:  [id number]  DATE OF BIRTH:  12/29/1955  DATE OF ADMISSION:  04/12/2016  PRIMARY CARE PHYSICIAN: Dr. Derinda Late  REQUESTING/REFERRING PHYSICIAN: Dr. Skip Estimable  CHIEF COMPLAINT:  No chief complaint on file.   HISTORY OF PRESENT ILLNESS:  Morgan Lam  is a 61 y.o. female with a known history of Cataracts, hyperlipidemia, arthritis, degenerative disc disease presents to the hospital for an elective right hip arthroplasty for arthritis. Patient had the surgery done on 04/12/16, doing very well today. No nausea or vomiting. Plan was to discharge home today. While working with physical therapy her heart rate went as high as 130s and so medical consult requested for the same. Patient denies any chest pain or palpitations. She is slightly anxious to get back home today. She has some discomfort from her surgical procedure but no significant pain. Appears very comfortable. She does take a baby aspirin at home. No known prior cardiac history or family history of any cardiac disease. No prior history of tachycardia. EKG reveals sinus tachycardia without any ST-T changes.  PAST MEDICAL HISTORY:   Past Medical History:  Diagnosis Date  . Cortical cataract   . DDD (degenerative disc disease), cervical   . DDD (degenerative disc disease), cervical   . Diverticulitis   . Hyperlipemia   . Migraines    history of migraines  . Pituitary adenoma ([place])   . PONV (postoperative nausea and vomiting)   . Vitamin D deficiency     PAST SURGICAL HISTOIRY:   Past Surgical History:  Procedure Laterality Date  . ANTERIOR CERVICAL DECOMP/DISCECTOMY FUSION Left 03/08/2016   Procedure: ANTERIOR CERVICAL DECOMPRESSION/DISCECTOMY FUSION 3 LEVELS;  Surgeon: Deetta Perla, MD;  Location: [facility] ORS;  Service: Neurosurgery;  Laterality: Left;  . CHOLECYSTECTOMY    . COLONOSCOPY WITH PROPOFOL N/A 12/04/2014    Procedure: COLONOSCOPY WITH PROPOFOL;  Surgeon: Manya Silvas, MD;  Location[facility]COPY;  Service: Endoscopy;  Laterality: N/A;  . Degenerative Disc    . EYE SURGERY     cataract os  . 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 TUBAL LIGATION      SOCIAL HISTORY:   Social History  Substance Use Topics  . Smoking status: Never Smoker  . Smokeless tobacco: Never Used  . Alcohol use 0.0 - 1.2 oz/week     Comment: occasionally- only wine    FAMILY HISTORY:   Family History  Problem Relation Age of Onset  . Diabetes Maternal Uncle   . Stomach cancer Maternal Uncle   . Hypertension Mother   . Arthritis Mother   . Cataracts Father   . Cancer Neg Hx     DRUG ALLERGIES:   Allergies  Allergen Reactions  . Acetaminophen-Codeine Nausea Only    Tylenol #3  . Codeine Nausea Only  . Ivp Dye [Iodinated Diagnostic Agents] Hives    Topical betadine pt is not allergic to.  . Other     Anesthesia-nausea/vomiting      REVIEW OF SYSTEMS:   Review of Systems  Constitutional: Negative for chills, fever, malaise/fatigue and weight loss.  HENT: Negative for ear discharge, ear pain, hearing loss, nosebleeds and tinnitus.   Eyes: Negative for blurred vision, double vision and photophobia.  Respiratory: Negative for cough, hemoptysis, shortness of breath  and wheezing.   Cardiovascular: Negative for chest pain, palpitations, orthopnea and leg swelling.  Gastrointestinal: Negative for abdominal pain, constipation, diarrhea, heartburn, melena, nausea and vomiting.  Genitourinary: Negative for dysuria, frequency, hematuria and urgency.  Musculoskeletal: Positive for joint pain and myalgias. Negative for back pain and neck pain.  Skin: Negative for rash.  Neurological: Negative for dizziness, tingling, tremors, sensory change, speech change, focal weakness  and headaches.  Endo/Heme/Allergies: Does not bruise/bleed easily.  Psychiatric/Behavioral: Negative for depression.    MEDICATIONS AT HOME:   Prior to Admission medications   Medication Sig Start Date End Date Taking? Authorizing Provider  Calcium Carb-Cholecalciferol (CALTRATE 600+D) 600-800 MG-UNIT TABS Take 1 tablet by mouth daily.   Yes Historical Provider, MD  Multiple Vitamin (MULTIVITAMIN WITH MINERALS) TABS tablet Take 1 tablet by mouth daily. Centrum silver   Yes Historical Provider, MD  aspirin 81 MG chewable tablet Chew 81 mg by mouth daily.    Historical Provider, MD  enoxaparin (LOVENOX) 30 MG/0.3ML injection Inject 0.4 mLs (40 mg total) into the skin daily. 04/14/16   Watt Climes, PA  oxyCODONE (OXY IR/ROXICODONE) 5 MG immediate release tablet Take 1-2 tablets (5-10 mg total) by mouth every 4 (four) hours as needed for severe pain. 04/14/16   Watt Climes, PA  traMADol (ULTRAM) 50 MG tablet Take 1-2 tablets (50-100 mg total) by mouth every 4 (four) hours as needed for moderate pain. 04/14/16   Watt Climes, PA      VITAL SIGNS:  Blood pressure (!) 151/77, pulse (!) 118, temperature 98.6 F (37 C), temperature source Oral, resp. rate 18, height 5\' 7"  (1.702 m), weight 57.7 kg (127 lb 5 oz), SpO2 100 %.  PHYSICAL EXAMINATION:   Physical Exam  GENERAL:  61 y.o.-year-old patient lying in the bed with no acute distress.  EYES: Pupils equal, round, reactive to light and accommodation. No scleral icterus. Extraocular muscles intact.  HEENT: Head atraumatic, normocephalic. Oropharynx and nasopharynx clear.  NECK:  Supple, no jugular venous distention. No thyroid enlargement, no tenderness.  LUNGS: Normal breath sounds bilaterally, no wheezing, rales,rhonchi or crepitation. No use of accessory muscles of respiration.  CARDIOVASCULAR: S1, S2 normal but rapid rate. No murmurs, rubs, or gallops.  ABDOMEN: Soft, nontender, nondistended. Bowel sounds present. No organomegaly or mass.   EXTREMITIES: No pedal edema, cyanosis, or clubbing. Swelling of right hip at surgical site. 2+ DP pulses palpable bilaterally NEUROLOGIC: Cranial nerves II through XII are intact. Muscle strength 5/5 in all extremities. Sensation intact. Gait not checked.  PSYCHIATRIC: The patient is alert and oriented x 3.  SKIN: No obvious rash, lesion, or ulcer.   LABORATORY PANEL:   CBC  Recent Labs Lab 04/14/16 0526  WBC 12.8*  HGB 9.3*  HCT 27.9*  PLT 165   ------------------------------------------------------------------------------------------------------------------  Chemistries   Recent Labs Lab 04/14/16 0526  NA 138  K 3.2*  CL 106  CO2 25  GLUCOSE 81  BUN 6  CREATININE 0.71  CALCIUM 8.3*   ------------------------------------------------------------------------------------------------------------------  Cardiac Enzymes No results for input(s): TROPONINI in the last 168 hours. ------------------------------------------------------------------------------------------------------------------  RADIOLOGY:  No results found.  EKG:   Orders placed or performed during the hospital encounter of 04/12/16  . EKG 12-Lead  . EKG 12-Lead    IMPRESSION AND PLAN:   Artasia Thang  is a 62 y.o. female with a known history of Cataracts, hyperlipidemia, arthritis, degenerative disc disease presents to the hospital for an elective right hip arthroplasty for arthritis.  #1  Sinus Tachycardia- as noted on EKG as well - replace potassium, give 1 dose of oral metoprolol - asymptomatic, and should be fine as long as HR <120. 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   All the records are reviewed and case discussed with Consulting provider. Management plans discussed with the patient, family and they are in agreement.  CODE STATUS: Full Code  TOTAL TIME TAKING CARE OF THIS PATIENT: 50 minutes.    Gladstone Lighter M.D on 04/14/2016 at 4:46 PM  Between 7am to 6pm - Pager - [phone]  After 6pm go to [email or web address] - password [email or web address] [facility]  Office  [phone]  CC: Primary care Physician: No PCP Per Patient

## 2016-04-14 NOTE — Progress Notes (Signed)
qPhysical Therapy Treatment Patient Details Name: Morgan Lam MRN: [id number] DOB: December 01, 1955 Today's Date: 04/14/2016    History of Present Illness Pt is a 61 yo female, s/p R THA (posterior hip precautions, WBAT) POD#1 for PT evaluation. PMHx includes DDD, HTN, HLD, cervical myelopathy, diverticulitis, recent Anterior decompression/disectomy and fusion of C3/4, C4/5, C5/6 vertebrae on 03/08/16.     PT Comments    Pt agreeable to PT; reports minimal R hip pain. Pt initial heart rate of 127 beats per minute; continued to monitor with fluctuating rate of 108 to 130 beats per minute. Checked with both therapist monitor and Dynamap with the same result. After some time heart rate remained in the low 100's. With stand increases again. Attempted 60 foot walk with heart rate increased to 142 beats per minute. BP checked 2 times at 148/79 and 139/77. Pt denies feeling heart racing or other adverse symptoms. Deferred stair climbing due to heart rate levels. Nurse notified and states she will check her medication. Pt left in chair with heart rate last checked at 108-110 beats per minute. Pt states she has a BP cuff at home with heart rate monitor on it; advised to keep an eye on it once discharged.   Follow Up Recommendations  Home health PT     Equipment Recommendations  Rolling walker with 5" wheels    Recommendations for Other Services       Precautions / Restrictions Precautions Precautions: Posterior Hip;Fall Restrictions Weight Bearing Restrictions: Yes RLE Weight Bearing: Weight bearing as tolerated    Mobility  Bed Mobility               General bed mobility comments: Not tested; pt up in chair  Transfers Overall transfer level: Needs assistance Equipment used: Rolling walker (2 wheeled) Transfers: Sit to/from Stand Sit to Stand: Min guard         General transfer comment: Pt demonstrates good technique and understanding of posterior hip  precautions  Ambulation/Gait Ambulation/Gait assistance: Min guard Ambulation Distance (Feet): 60 Feet Assistive device: Rolling walker (2 wheeled) Gait Pattern/deviations: Step-through pattern Gait velocity: reduced Gait velocity interpretation: Below normal speed for age/gender General Gait Details: Several cues for safety, as pt lifts rw over objects. Encouraged to maneuver around object   Stairs         General stair comments: PT does have 2 steps to enter at the end of her walk; noting 1 step and the second to a landing. Deferred stair climbing due to fluctuating heart rate that raises to 142 with slow ambulation. Pt's spouse will be there to assist; pt demonstrates good strength throuhout to tolerate steps, which have railings  Wheelchair Mobility    Modified Rankin (Stroke Patients Only)       Balance Overall balance assessment: Needs assistance Sitting-balance support: Feet supported Sitting balance-Leahy Scale: Good     Standing balance support: Bilateral upper extremity supported Standing balance-Leahy Scale: Good                              Cognition Arousal/Alertness: Awake/alert Behavior During Therapy: WFL for tasks assessed/performed Overall Cognitive Status: Within Functional Limits for tasks assessed                                        Exercises: Quad sets 20x  Gluteal sets 20x                      FAQ 20x seated                      Hip ABD R 10x seated                      Hip march R 10x stand                      Toe rais B 20x seated                     Heel raise B 20x seated     General Comments        Pertinent Vitals/Pain Pain Assessment: 0-10 Pain Score: 1  Pain Location: R hip Pain Intervention(s): Monitored during session;Premedicated before session    Home Living                      Prior Function            PT Goals (current goals can now be found in the  care plan section) Progress towards PT goals: Progressing toward goals    Frequency    BID      PT Plan Current plan remains appropriate    Co-evaluation             End of Session Equipment Utilized During Treatment: Gait belt Activity Tolerance: Patient tolerated treatment well;Other (comment) (limited by increasing HR) Patient left: in chair;with call bell/phone within reach;Other (comment) Nurse Communication: Other (comment) (concernes with heart rate) PT Visit Diagnosis: Unsteadiness on feet (R26.81);Muscle weakness (generalized) (M62.81);Pain Pain - Right/Left: Right Pain - part of body: Hip     Time: 1011-1047 PT Time Calculation (min) (ACUTE ONLY): 36 min  Charges:  $Gait Training: 8-22 mins $Therapeutic Exercise: 8-22 mins                    G Codes:        Larae Grooms, PTA 04/14/2016, 11:53 AM

## 2016-05-20 ENCOUNTER — Telehealth: Payer: Self-pay | Admitting: Obstetrics and Gynecology

## 2016-05-20 NOTE — Telephone Encounter (Signed)
Patient lvm to schedule future appointment, I called the patient and lvm to schedule appointment.

## 2016-06-04 NOTE — Progress Notes (Signed)
Patient ID: Morgan Lam, female   DOB: 20-Jun-1955, 61 y.o.   MRN: [id number] ANNUAL PREVENTATIVE CARE GYN  ENCOUNTER NOTE  Subjective:       Morgan Lam is a 61 y.o. G53P1001 female here for a routine annual gynecologic exam.  Current complaints: 1.  none    Gynecologic History No LMP recorded. Patient is postmenopausal. Contraception: post menopausal status Last Pap: 2015. Neg/neg Results were: normal Last mammogram: 07/2015 birad 1. Results were: normal  Obstetric History OB History  Gravida Para Term Preterm AB Living  1 1 1     1   SAB TAB Ectopic Multiple Live Births          1    # Outcome Date GA Lbr Len/2nd Weight Sex Delivery Anes PTL Lv  1 Term 1991   7 lb (3.175 kg) M VBAC   LIV      Past Medical History:  Diagnosis Date  . Cortical cataract   . DDD (degenerative disc disease), cervical   . DDD (degenerative disc disease), cervical   . Diverticulitis   . Hyperlipemia   . Migraines    history of migraines  . Pituitary adenoma ([place])   . PONV (postoperative nausea and vomiting)   . Vitamin D deficiency     Past Surgical History:  Procedure Laterality Date  . ANTERIOR CERVICAL DECOMP/DISCECTOMY FUSION Left 03/08/2016   Procedure: ANTERIOR CERVICAL DECOMPRESSION/DISCECTOMY FUSION 3 LEVELS;  Surgeon: Deetta Perla, MD;  Location: [facility] ORS;  Service: Neurosurgery;  Laterality: Left;  . CHOLECYSTECTOMY    . COLONOSCOPY WITH PROPOFOL N/A 12/04/2014   Procedure: COLONOSCOPY WITH PROPOFOL;  Surgeon: Manya Silvas, MD;  Location: [facility];  Service: Endoscopy;  Laterality: N/A;  . Degenerative Disc    . EYE SURGERY     cataract os  . 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 TUBAL LIGATION      Current Outpatient Prescriptions on File Prior to Visit  Medication Sig Dispense Refill  . aspirin 81 MG chewable tablet Chew 81 mg by  mouth daily.    . Calcium Carb-Cholecalciferol (CALTRATE 600+D) 600-800 MG-UNIT TABS Take 1 tablet by mouth daily.    Marland Kitchen enoxaparin (LOVENOX) 30 MG/0.3ML injection Inject 0.4 mLs (40 mg total) into the skin daily. 14 Syringe 0  . metoprolol tartrate (LOPRESSOR) 25 MG tablet Take 1 tablet (25 mg total) by mouth 2 (two) times daily. X 5 days. Call PCP if heart rate >120 20 tablet 0  . Multiple Vitamin (MULTIVITAMIN WITH MINERALS) TABS tablet Take 1 tablet by mouth daily. Centrum silver    . oxyCODONE (OXY IR/ROXICODONE) 5 MG immediate release tablet Take 1-2 tablets (5-10 mg total) by mouth every 4 (four) hours as needed for severe pain. 30 tablet 0  . traMADol (ULTRAM) 50 MG tablet Take 1-2 tablets (50-100 mg total) by mouth every 4 (four) hours as needed for moderate pain. 60 tablet 0   No current facility-administered medications on file prior to visit.     Allergies  Allergen Reactions  . Acetaminophen-Codeine Nausea Only    Tylenol #3  . Codeine Nausea Only  . Ivp Dye [Iodinated Diagnostic Agents] Hives    Topical betadine pt is not allergic to.  . Other     Anesthesia-nausea/vomiting      Social History   Social History  . Marital status: Single  Spouse name: N/A  . Number of children: N/A  . Years of education: N/A   Occupational History  . Not on file.   Social History Main Topics  . Smoking status: Never Smoker  . Smokeless tobacco: Never Used  . Alcohol use 0.0 - 1.2 oz/week     Comment: occasionally- only wine  . Drug use: No  . Sexual activity: Yes   Other Topics Concern  . Not on file   Social History Narrative   Lives at home with husband. Independent at baseline    Family History  Problem Relation Age of Onset  . Diabetes Maternal Uncle   . Stomach cancer Maternal Uncle   . Hypertension Mother   . Arthritis Mother   . Cataracts Father   . Cancer Neg Hx     The following portions of the patient's history were reviewed and updated as  appropriate: allergies, current medications, past family history, past medical history, past social history, past surgical history and problem list.  Review of Systems ROS    Objective:   BP 126/77   Pulse 86   Ht 5\' 7"  (1.702 m)   Wt 117 lb 9.6 oz (53.3 kg)   BMI 18.42 kg/m  CONSTITUTIONAL: Well-developed, well-nourished female in no acute distress.  PSYCHIATRIC: Normal mood and affect. Normal behavior. Normal judgment and thought content. [place]: Alert and oriented to person, place, and time. Normal muscle tone coordination. No cranial nerve deficit noted. HENT:  Normocephalic, atraumatic, External right and left ear normal. Oropharynx is clear and moist EYES: Conjunctivae and EOM are normal. Pupils are equal, round, and reactive to light. No scleral icterus.  NECK: Normal range of motion, supple, no masses.  Normal thyroid.  SKIN: Skin is warm and dry. No rash noted. Not diaphoretic. No erythema. No pallor. CARDIOVASCULAR: Normal heart rate noted, regular rhythm, no murmur. RESPIRATORY: Clear to auscultation bilaterally. Effort and breath sounds normal, no problems with respiration noted. BREASTS: Symmetric in size. No masses, skin changes, nipple drainage, or lymphadenopathy. ABDOMEN: Soft, normal bowel sounds, no distention noted.  No tenderness, rebound or guarding.  BLADDER: Normal PELVIC:  External Genitalia: Normal  BUS: Normal  Vagina: Moderate vaginal atrophy  Cervix: Atrophic; no lesions  Uterus: Small, mobile, nontender  Adnexa: Normal  RV: External Exam NormaI, No Rectal Masses and Normal Sphincter tone  MUSCULOSKELETAL: Normal range of motion. No tenderness.  No cyanosis, clubbing, or edema.  2+ distal pulses. LYMPHATIC: No Axillary, Supraclavicular, or Inguinal Adenopathy.    Assessment:   Annual gynecologic examination 61 y.o. Contraception: post menopausal status Normal BMI Problem List Items Addressed This Visit    Menopause   Vaginal atrophy     Other Visit Diagnoses    Well woman exam    -  Primary   Screen for colon cancer          Plan Pap smear- pap whpv Mammogram: ordered Stool Guaiac Testing:  Ordered Labs: Per primary care Routine preventative health maintenance measures emphasized: Exercise/Diet/Weight control, Tobacco Warnings and Alcohol/Substance use risks Return to [place], [place]    Note: This dictation was prepared with Dragon dictation along with smaller phrase technology. Any transcriptional errors that result from this process are unintentional.

## 2016-06-09 ENCOUNTER — Encounter: Payer: Self-pay | Admitting: Obstetrics and Gynecology

## 2016-06-09 ENCOUNTER — Encounter: Payer: BC Managed Care – PPO | Admitting: Obstetrics and Gynecology

## 2016-06-09 VITALS — BP 126/77 | HR 86 | Ht 67.0 in | Wt 117.6 lb

## 2016-06-15 NOTE — Progress Notes (Signed)
This encounter was created in error - please disregard.

## 2016-06-30 ENCOUNTER — Other Ambulatory Visit: Payer: Self-pay | Admitting: Family Medicine

## 2016-06-30 DIAGNOSIS — Z1231 Encounter for screening mammogram for malignant neoplasm of breast: Secondary | ICD-10-CM

## 2016-08-02 ENCOUNTER — Encounter: Payer: Self-pay | Admitting: Obstetrics and Gynecology

## 2016-08-02 ENCOUNTER — Ambulatory Visit (INDEPENDENT_AMBULATORY_CARE_PROVIDER_SITE_OTHER): Payer: BC Managed Care – PPO | Admitting: Obstetrics and Gynecology

## 2016-08-02 VITALS — BP 179/69 | HR 86 | Ht 67.0 in | Wt 117.6 lb

## 2016-08-02 DIAGNOSIS — Z01419 Encounter for gynecological examination (general) (routine) without abnormal findings: Secondary | ICD-10-CM | POA: Diagnosis not present

## 2016-08-02 DIAGNOSIS — Z780 Asymptomatic menopausal state: Secondary | ICD-10-CM | POA: Diagnosis not present

## 2016-08-02 DIAGNOSIS — Z1211 Encounter for screening for malignant neoplasm of colon: Secondary | ICD-10-CM | POA: Diagnosis not present

## 2016-08-02 DIAGNOSIS — N952 Postmenopausal atrophic vaginitis: Secondary | ICD-10-CM | POA: Diagnosis not present

## 2016-08-02 NOTE — Patient Instructions (Signed)
1. Pap smear is done 2. Mammogram is already ordered. 3. Stool guaiac cards are given for colon cancer screening 4. Screening labs are to be obtained through primary care 5. Continue with calcium and vitamin D supplementation 6. Continue with healthy eating and exercise 7. Return in 1 year for annual exam   Health Maintenance for Postmenopausal Women Menopause is a normal process in which your reproductive ability comes to an end. This process happens gradually over a span of months to years, usually between the ages of 67 and 58. Menopause is complete when you have missed 12 consecutive menstrual periods. It is important to talk with your health care provider about some of the most common conditions that affect postmenopausal women, such as heart disease, cancer, and bone loss (osteoporosis). Adopting a healthy lifestyle and getting preventive care can help to promote your health and wellness. Those actions can also lower your chances of developing some of these common conditions. What should I know about menopause? During menopause, you may experience a number of symptoms, such as:  Moderate-to-severe hot flashes.  Night sweats.  Decrease in sex drive.  Mood swings.  Headaches.  Tiredness.  Irritability.  Memory problems.  Insomnia.  Choosing to treat or not to treat menopausal changes is an individual decision that you make with your health care provider. What should I know about hormone replacement therapy and supplements? Hormone therapy products are effective for treating symptoms that are associated with menopause, such as hot flashes and night sweats. Hormone replacement carries certain risks, especially as you become older. If you are thinking about using estrogen or estrogen with progestin treatments, discuss the benefits and risks with your health care provider. What should I know about heart disease and stroke? Heart disease, heart attack, and stroke become more  likely as you age. This may be due, in part, to the hormonal changes that your body experiences during menopause. These can affect how your body processes dietary fats, triglycerides, and cholesterol. Heart attack and stroke are both medical emergencies. There are many things that you can do to help prevent heart disease and stroke:  Have your blood pressure checked at least every 1-2 years. High blood pressure causes heart disease and increases the risk of stroke.  If you are 58-3 years old, ask your health care provider if you should take aspirin to prevent a heart attack or a stroke.  Do not use any tobacco products, including cigarettes, chewing tobacco, or electronic cigarettes. If you need help quitting, ask your health care provider.  It is important to eat a healthy diet and maintain a healthy weight. ? Be sure to include plenty of vegetables, fruits, low-fat dairy products, and lean protein. ? Avoid eating foods that are high in solid fats, added sugars, or salt (sodium).  Get regular exercise. This is one of the most important things that you can do for your health. ? Try to exercise for at least 150 minutes each week. The type of exercise that you do should increase your heart rate and make you sweat. This is known as moderate-intensity exercise. ? Try to do strengthening exercises at least twice each week. Do these in addition to the moderate-intensity exercise.  Know your numbers.Ask your health care provider to check your cholesterol and your blood glucose. Continue to have your blood tested as directed by your health care provider.  What should I know about cancer screening? There are several types of cancer. Take the following steps to reduce  your risk and to catch any cancer development as early as possible. Breast Cancer  Practice breast self-awareness. ? This means understanding how your breasts normally appear and feel. ? It also means doing regular breast self-exams.  Let your health care provider know about any changes, no matter how small.  If you are 40 or older, have a clinician do a breast exam (clinical breast exam or CBE) every year. Depending on your age, family history, and medical history, it may be recommended that you also have a yearly breast X-ray (mammogram).  If you have a family history of breast cancer, talk with your health care provider about genetic screening.  If you are at high risk for breast cancer, talk with your health care provider about having an MRI and a mammogram every year.  Breast cancer (BRCA) gene test is recommended for women who have family members with BRCA-related cancers. Results of the assessment will determine the need for genetic counseling and BRCA1 and for BRCA2 testing. BRCA-related cancers include these types: ? Breast. This occurs in males or females. ? Ovarian. ? Tubal. This may also be called fallopian tube cancer. ? Cancer of the abdominal or pelvic lining (peritoneal cancer). ? Prostate. ? Pancreatic.  Cervical, Uterine, and Ovarian Cancer Your health care provider may recommend that you be screened regularly for cancer of the pelvic organs. These include your ovaries, uterus, and vagina. This screening involves a pelvic exam, which includes checking for microscopic changes to the surface of your cervix (Pap test).  For women ages 21-65, health care providers may recommend a pelvic exam and a Pap test every three years. For women ages 30-65, they may recommend the Pap test and pelvic exam, combined with testing for human papilloma virus (HPV), every five years. Some types of HPV increase your risk of cervical cancer. Testing for HPV may also be done on women of any age who have unclear Pap test results.  Other health care providers may not recommend any screening for nonpregnant women who are considered low risk for pelvic cancer and have no symptoms. Ask your health care provider if a screening pelvic exam  is right for you.  If you have had past treatment for cervical cancer or a condition that could lead to cancer, you need Pap tests and screening for cancer for at least 20 years after your treatment. If Pap tests have been discontinued for you, your risk factors (such as having a new sexual partner) need to be reassessed to determine if you should start having screenings again. Some women have medical problems that increase the chance of getting cervical cancer. In these cases, your health care provider may recommend that you have screening and Pap tests more often.  If you have a family history of uterine cancer or ovarian cancer, talk with your health care provider about genetic screening.  If you have vaginal bleeding after reaching menopause, tell your health care provider.  There are currently no reliable tests available to screen for ovarian cancer.  Lung Cancer Lung cancer screening is recommended for adults 55-80 years old who are at high risk for lung cancer because of a history of smoking. A yearly low-dose CT scan of the lungs is recommended if you:  Currently smoke.  Have a history of at least 30 pack-years of smoking and you currently smoke or have quit within the past 15 years. A pack-year is smoking an average of one pack of cigarettes per day for one year.    Yearly screening should:  Continue until it has been 15 years since you quit.  Stop if you develop a health problem that would prevent you from having lung cancer treatment.  Colorectal Cancer  This type of cancer can be detected and can often be prevented.  Routine colorectal cancer screening usually begins at age 87 and continues through age 61.  If you have risk factors for colon cancer, your health care provider may recommend that you be screened at an earlier age.  If you have a family history of colorectal cancer, talk with your health care provider about genetic screening.  Your health care provider may also  recommend using home test kits to check for hidden blood in your stool.  A small camera at the end of a tube can be used to examine your colon directly (sigmoidoscopy or colonoscopy). This is done to check for the earliest forms of colorectal cancer.  Direct examination of the colon should be repeated every 5-10 years until age 50. However, if early forms of precancerous polyps or small growths are found or if you have a family history or genetic risk for colorectal cancer, you may need to be screened more often.  Skin Cancer  Check your skin from head to toe regularly.  Monitor any moles. Be sure to tell your health care provider: ? About any new moles or changes in moles, especially if there is a change in a mole's shape or color. ? If you have a mole that is larger than the size of a pencil eraser.  If any of your family members has a history of skin cancer, especially at a young age, talk with your health care provider about genetic screening.  Always use sunscreen. Apply sunscreen liberally and repeatedly throughout the day.  Whenever you are outside, protect yourself by wearing long sleeves, pants, a wide-brimmed hat, and sunglasses.  What should I know about osteoporosis? Osteoporosis is a condition in which bone destruction happens more quickly than new bone creation. After menopause, you may be at an increased risk for osteoporosis. To help prevent osteoporosis or the bone fractures that can happen because of osteoporosis, the following is recommended:  If you are 78-40 years old, get at least 1,000 mg of calcium and at least 600 mg of vitamin D per day.  If you are older than age 73 but younger than age 18, get at least 1,200 mg of calcium and at least 600 mg of vitamin D per day.  If you are older than age 74, get at least 1,200 mg of calcium and at least 800 mg of vitamin D per day.  Smoking and excessive alcohol intake increase the risk of osteoporosis. Eat foods that are  rich in calcium and vitamin D, and do weight-bearing exercises several times each week as directed by your health care provider. What should I know about how menopause affects my mental health? Depression may occur at any age, but it is more common as you become older. Common symptoms of depression include:  Low or sad mood.  Changes in sleep patterns.  Changes in appetite or eating patterns.  Feeling an overall lack of motivation or enjoyment of activities that you previously enjoyed.  Frequent crying spells.  Talk with your health care provider if you think that you are experiencing depression. What should I know about immunizations? It is important that you get and maintain your immunizations. These include:  Tetanus, diphtheria, and pertussis (Tdap) booster vaccine.  Influenza  every year before the flu season begins.  Pneumonia vaccine.  Shingles vaccine.  Your health care provider may also recommend other immunizations. This information is not intended to replace advice given to you by your health care provider. Make sure you discuss any questions you have with your health care provider. Document Released: 02/19/2005 Document Revised: 07/18/2015 Document Reviewed: 10/01/2014 Elsevier Interactive Patient Education  2018 [employer].

## 2016-08-02 NOTE — Progress Notes (Signed)
Patient ID: Morgan Lam, female   DOB: 1955/02/02, 61 y.o.   MRN: [id number] ANNUAL PREVENTATIVE CARE GYN  ENCOUNTER NOTE  Subjective:       Morgan Lam is a 61 y.o. G47P1001 female here for a routine annual gynecologic exam.  Current complaints: 1.  none   Patient had cervical disc laminectomies this past year and now has resolution of back pain and paresthesias. She also had right hip replacement and is recovering from that in April 2018. Bowel function is normal. Bladder function is normal. The patient is sexually active and uses lubricants for vaginal dryness.   Gynecologic History No LMP recorded. Patient is postmenopausal. Contraception: post menopausal status Last Pap: 2015 NEG/NEG. Results were: normal Last mammogram: 07/2015 BIRAD 1 SCHEDULED 08/09/2016 . Results were: normal  Obstetric History OB History  Gravida Para Term Preterm AB Living  1 1 1     1   SAB TAB Ectopic Multiple Live Births          1    # Outcome Date GA Lbr Len/2nd Weight Sex Delivery Anes PTL Lv  1 Term 1991   7 lb (3.175 kg) M VBAC   LIV      Past Medical History:  Diagnosis Date  . Cortical cataract   . DDD (degenerative disc disease), cervical   . DDD (degenerative disc disease), cervical   . Diverticulitis   . Hyperlipemia   . Migraines    history of migraines  . Pituitary adenoma ([place])   . PONV (postoperative nausea and vomiting)   . Vitamin D deficiency     Past Surgical History:  Procedure Laterality Date  . ANTERIOR CERVICAL DECOMP/DISCECTOMY FUSION Left 03/08/2016   Procedure: ANTERIOR CERVICAL DECOMPRESSION/DISCECTOMY FUSION 3 LEVELS;  Surgeon: Deetta Perla, MD;  Location: [facility] ORS;  Service: Neurosurgery;  Laterality: Left;  . CHOLECYSTECTOMY    . COLONOSCOPY WITH PROPOFOL N/A 12/04/2014   Procedure: COLONOSCOPY WITH PROPOFOL;  Surgeon: Manya Silvas, MD;  Location: [facility] ENDOSCOPY;  Service: Endoscopy;  Laterality: N/A;  . Degenerative Disc    . EYE SURGERY     cataract  os  . 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 TUBAL LIGATION      Current Outpatient Prescriptions on File Prior to Visit  Medication Sig Dispense Refill  . acetaminophen (TYLENOL) 650 MG CR tablet Take by mouth.    . Calcium Carb-Cholecalciferol (CALTRATE 600+D) 600-800 MG-UNIT TABS Take 1 tablet by mouth daily.    . metoprolol tartrate (LOPRESSOR) 25 MG tablet Take 1 tablet (25 mg total) by mouth 2 (two) times daily. X 5 days. Call PCP if heart rate >120 20 tablet 0  . Multiple Vitamin (MULTIVITAMIN WITH MINERALS) TABS tablet Take 1 tablet by mouth daily. Centrum silver    . oxyCODONE (OXY IR/ROXICODONE) 5 MG immediate release tablet Take 1-2 tablets (5-10 mg total) by mouth every 4 (four) hours as needed for severe pain. 30 tablet 0  . traMADol (ULTRAM) 50 MG tablet Take 1-2 tablets (50-100 mg total) by mouth every 4 (four) hours as needed for moderate pain. 60 tablet 0   No current facility-administered medications on file prior to visit.     Allergies  Allergen Reactions  . Acetaminophen-Codeine Nausea Only    Tylenol #3  . Codeine Nausea Only  . Ivp Dye [Iodinated Diagnostic Agents] Hives  Topical betadine pt is not allergic to.  . Other     Anesthesia-nausea/vomiting      Social History   Social History  . Marital status: Single    Spouse name: N/A  . Number of children: N/A  . Years of education: N/A   Occupational History  . Not on file.   Social History Main Topics  . Smoking status: Never Smoker  . Smokeless tobacco: Never Used  . Alcohol use 0.0 - 1.2 oz/week     Comment: occasionally- only wine  . Drug use: No  . Sexual activity: Yes    Birth control/ protection: Post-menopausal   Other Topics Concern  . Not on file   Social History Narrative   Lives at home with husband. Independent at baseline    Family History   Problem Relation Age of Onset  . Diabetes Maternal Uncle   . Stomach cancer Maternal Uncle   . Hypertension Mother   . Arthritis Mother   . Stomach cancer Mother   . Cataracts Father   . Cancer Neg Hx     The following portions of the patient's history were reviewed and updated as appropriate: allergies, current medications, past family history, past medical history, past social history, past surgical history and problem list.  Review of Systems Review of Systems  Constitutional: Negative.   HENT: Negative.   Eyes: Negative.   Respiratory: Negative.   Cardiovascular: Negative.   Gastrointestinal: Negative.   Genitourinary: Negative.   Musculoskeletal:       Back pain is resolved post surgery Paresthesias are resolved post surgery  Skin: Negative.   Neurological: Negative.   Endo/Heme/Allergies: Negative.   Psychiatric/Behavioral: Negative.      Objective:   BP (!) 179/69   Pulse 86   Ht 5\' 7"  (1.702 m)   Wt 117 lb 9.6 oz (53.3 kg)   BMI 18.42 kg/m  CONSTITUTIONAL: Well-developed, well-nourished female in no acute distress.  PSYCHIATRIC: Normal mood and affect. Normal behavior. Normal judgment and thought content. [place]: Alert and oriented to person, place, and time. Normal muscle tone coordination. No cranial nerve deficit noted. HENT:  Normocephalic, atraumatic, External right and left ear normal. Oropharynx is clear and moist EYES: Conjunctivae and EOM are normal.No scleral icterus.  NECK: Normal range of motion, supple, no masses.  Normal thyroid.  SKIN: Skin is warm and dry. No rash noted. Not diaphoretic. No erythema. No pallor. CARDIOVASCULAR: Normal heart rate noted, regular rhythm, no murmur. RESPIRATORY: Clear to auscultation bilaterally. Effort and breath sounds normal, no problems with respiration noted. BREASTS: Symmetric in size. No masses, skin changes, nipple drainage, or lymphadenopathy. ABDOMEN: Soft, normal bowel sounds, no distention noted.  No  tenderness, rebound or guarding.  BLADDER: Normal PELVIC:  External Genitalia: Normal  BUS: Normal  Vagina: Moderate vaginal atrophy; Single digit exam is performed  Cervix: Atrophic; no lesions  Uterus: Small, mobile, nontender  Adnexa: Normal  RV: External Exam NormaI, No Rectal Masses and Normal Sphincter tone  MUSCULOSKELETAL: Normal range of motion. No tenderness.  No cyanosis, clubbing, or edema.  2+ distal pulses. LYMPHATIC: No Axillary, Supraclavicular, or Inguinal Adenopathy.    Assessment:   Annual gynecologic examination 61 y.o. Contraception: post menopausal status Normal BMI Problem List Items Addressed This Visit    Menopause   Vaginal atrophy    Other Visit Diagnoses    Well woman exam    -  Primary   Screen for colon cancer  Plan:  Pap: PAP/HPV Mammogram: scheduled 08/09/2016 Stool Guaiac Testing:  Ordered Labs: Per primary care Routine preventative health maintenance measures emphasized: Exercise/Diet/Weight control, Tobacco Warnings and Alcohol/Substance use risks Return to [place], CMA  Brayton Mars, MD  Note: This dictation was prepared with Dragon dictation along with smaller phrase technology. Any transcriptional errors that result from this process are unintentional.

## 2016-08-04 LAB — PAP IG AND HPV HIGH-RISK
HPV, HIGH-RISK: NEGATIVE
PAP SMEAR COMMENT: 0

## 2016-08-09 ENCOUNTER — Ambulatory Visit
Admission: RE | Admit: 2016-08-09 | Discharge: 2016-08-09 | Disposition: A | Discharge status: Home / Self Care | Payer: BC Managed Care – PPO | Source: Ambulatory Visit | Attending: Family Medicine | Admitting: Family Medicine

## 2016-08-09 DIAGNOSIS — Z1231 Encounter for screening mammogram for malignant neoplasm of breast: Secondary | ICD-10-CM | POA: Diagnosis present

## 2016-08-11 LAB — FECAL OCCULT BLOOD, IMMUNOCHEMICAL: FECAL OCCULT BLD: NEGATIVE

## 2017-07-22 NOTE — Progress Notes (Signed)
Patient ID: Morgan Lam, female   DOB: [birth year], 62 y.o.   MRN: [id number] ANNUAL PREVENTATIVE CARE GYN  ENCOUNTER NOTE  Subjective:       Morgan Lam is a 62 y.o. G80P1001 female here for a routine annual gynecologic exam.  Current complaints:  1.  none   No major interval health issues have been identified. Bowel function is normal. Bladder function is normal. The patient is sexually active and uses lubricants for vaginal dryness.   Gynecologic History No LMP recorded. Patient is postmenopausal. Contraception: post menopausal status Last Pap: 07/2016 NEG/NEG. Results were: normal Last mammogram:  08/09/2016  birad 1 Results were: normal  Obstetric History OB History  Gravida Para Term Preterm AB Living  1 1 1     1   SAB TAB Ectopic Multiple Live Births          1    # Outcome Date GA Lbr Len/2nd Weight Sex Delivery Anes PTL Lv  1 Term 1991   7 lb (3.175 kg) M VBAC   LIV    Past Medical History:  Diagnosis Date  . Cortical cataract   . DDD (degenerative disc disease), cervical   . DDD (degenerative disc disease), cervical   . Diverticulitis   . Hyperlipemia   . Migraines    history of migraines  . Pituitary adenoma ([place])   . PONV (postoperative nausea and vomiting)   . Vitamin D deficiency     Past Surgical History:  Procedure Laterality Date  . ANTERIOR CERVICAL DECOMP/DISCECTOMY FUSION Left 03/08/2016   Procedure: ANTERIOR CERVICAL DECOMPRESSION/DISCECTOMY FUSION 3 LEVELS;  Surgeon: Deetta Perla, MD;  Location: [facility] ORS;  Service: Neurosurgery;  Laterality: Left;  . CHOLECYSTECTOMY    . COLONOSCOPY WITH PROPOFOL N/A 12/04/2014   Procedure: COLONOSCOPY WITH PROPOFOL;  Surgeon: Manya Silvas, MD;  Location: [facility] ENDOSCOPY;  Service: Endoscopy;  Laterality: N/A;  . Degenerative Disc    . EYE SURGERY     cataract os  . TOTAL HIP ARTHROPLASTY Right 04/12/2016   Procedure: TOTAL HIP ARTHROPLASTY;  Surgeon: Dereck Leep, MD;  Location: [facility] ORS;  Service:  Orthopedics;  Laterality: Right;  . TUBAL LIGATION      Current Outpatient Medications on File Prior to Visit  Medication Sig Dispense Refill  . acetaminophen (TYLENOL) 650 MG CR tablet Take by mouth.    Marland Kitchen aspirin EC 81 MG tablet Take by mouth.    . Calcium Carb-Cholecalciferol (CALTRATE 600+D) 600-800 MG-UNIT TABS Take 1 tablet by mouth daily.    . Multiple Vitamin (MULTIVITAMIN WITH MINERALS) TABS tablet Take 1 tablet by mouth daily. Centrum silver     No current facility-administered medications on file prior to visit.     Allergies  Allergen Reactions  . Acetaminophen-Codeine Nausea Only    Tylenol #3  . Codeine Nausea Only  . Ivp Dye [Iodinated Diagnostic Agents] Hives    Topical betadine pt is not allergic to.  . Other     Anesthesia-nausea/vomiting      Social History   Socioeconomic History  . Marital status: Single    Spouse name: Not on file  . Number of children: Not on file  . Years of education: Not on file  . Highest education level: Not on file  Occupational History  . Not on file  Social Needs  . Financial resource strain: Not on file  . Food insecurity:    Worry: Not on file    Inability: Not on file  .  Transportation needs:    Medical: Not on file    Non-medical: Not on file  Tobacco Use  . Smoking status: Never Smoker  . Smokeless tobacco: Never Used  Substance and Sexual Activity  . Alcohol use: Yes    Alcohol/week: 0.0 - 1.2 oz    Comment: occasionally- only wine  . Drug use: No  . Sexual activity: Yes    Birth control/protection: Post-menopausal  Lifestyle  . Physical activity:    Days per week: Not on file    Minutes per session: Not on file  . Stress: Not on file  Relationships  . Social connections:    Talks on phone: Not on file    Gets together: Not on file    Attends religious service: Not on file    Active member of club or organization: Not on file    Attends meetings of clubs or organizations: Not on file     Relationship status: Not on file  . Intimate partner violence:    Fear of current or ex partner: Not on file    Emotionally abused: Not on file    Physically abused: Not on file    Forced sexual activity: Not on file  Other Topics Concern  . Not on file  Social History Narrative   Lives at home with husband. Independent at baseline    Family History  Problem Relation Age of Onset  . Diabetes Maternal Uncle   . Stomach cancer Maternal Uncle   . Hypertension Mother   . Arthritis Mother   . Stomach cancer Mother   . Cataracts Father   . Cancer Neg Hx   . Breast cancer Neg Hx   . Ovarian cancer Neg Hx   . Colon cancer Neg Hx     The following portions of the patient's history were reviewed and updated as appropriate: allergies, current medications, past family history, past medical history, past social history, past surgical history and problem list.  Review of Systems Review of Systems  Constitutional:       Mild vasomotor symptoms, not desiring treatment  HENT: Negative.   Eyes: Negative.   Respiratory: Negative.   Cardiovascular: Negative.   Gastrointestinal: Negative.   Genitourinary:       Vaginal dryness requires lubricant therapy  Musculoskeletal: Negative.   Skin: Negative.   Neurological: Negative.   Endo/Heme/Allergies: Negative.   Psychiatric/Behavioral: Negative.       Objective:   BP (!) 159/66   Pulse 75   Ht 5\' 7"  (1.702 m)   Wt 127 lb (57.6 kg)   BMI 19.89 kg/m  CONSTITUTIONAL: Well-developed, well-nourished female in no acute distress.  PSYCHIATRIC: Normal mood and affect. Normal behavior. Normal judgment and thought content. [place]: Alert and oriented to person, place, and time. Normal muscle tone coordination. No cranial nerve deficit noted. HENT:  Normocephalic, atraumatic, External right and left ear normal.  EYES: Conjunctivae and EOM are normal.No scleral icterus.  NECK: Normal range of motion, supple, no masses.  Normal thyroid.   SKIN: Skin is warm and dry. No rash noted. Not diaphoretic. No erythema. No pallor. CARDIOVASCULAR: Normal heart rate noted, regular rhythm, no murmur. RESPIRATORY: Clear to auscultation bilaterally. Effort and breath sounds normal, no problems with respiration noted. BREASTS: Symmetric in size. No masses, skin changes, nipple drainage, or lymphadenopathy. ABDOMEN: Soft, normal bowel sounds, no distention noted.  No tenderness, rebound or guarding.  BLADDER: Normal PELVIC:  External Genitalia: Normal  BUS: Normal  Vagina: Moderate vaginal atrophy;  Single digit exam is performed  Cervix: Atrophic; no lesions  Uterus: Small, mobile, nontender, slightly deviated to the left  Adnexa: Normal; nonpalpable nontender  RV: External Exam NormaI, No Rectal Masses and Normal Sphincter tone  MUSCULOSKELETAL: Normal range of motion. No tenderness.  No cyanosis, clubbing, or edema.  2+ distal pulses. LYMPHATIC: No Axillary, Supraclavicular, or Inguinal Adenopathy.    Assessment:   Annual gynecologic examination 62 y.o. Contraception: post menopausal status Normal BMI Vaginal atrophy Menopause minimally symptomatic Elevated blood pressure reading today  Plan:  Pap: Due 2021 Mammogram: ordered Stool Guaiac Testing:  Ordered Labs: Per primary care Routine preventative health maintenance measures emphasized: Exercise/Diet/Weight control, Tobacco Warnings and Alcohol/Substance use risks  Continue to monitor blood pressure at home and through primary care Continue with calcium and vitamin D supplementation daily Continue with vaginal lubricants as needed Return to [place], CMA  Brayton Mars, MD   Note: This dictation was prepared with Dragon dictation along with smaller phrase technology. Any transcriptional errors that result from this process are unintentional.

## 2017-07-26 ENCOUNTER — Ambulatory Visit (INDEPENDENT_AMBULATORY_CARE_PROVIDER_SITE_OTHER): Payer: BC Managed Care – PPO | Admitting: Obstetrics and Gynecology

## 2017-07-26 ENCOUNTER — Encounter: Payer: Self-pay | Admitting: Obstetrics and Gynecology

## 2017-07-26 VITALS — BP 159/66 | HR 75 | Ht 67.0 in | Wt 127.0 lb

## 2017-07-26 DIAGNOSIS — N952 Postmenopausal atrophic vaginitis: Secondary | ICD-10-CM | POA: Diagnosis not present

## 2017-07-26 DIAGNOSIS — Z780 Asymptomatic menopausal state: Secondary | ICD-10-CM

## 2017-07-26 DIAGNOSIS — Z01419 Encounter for gynecological examination (general) (routine) without abnormal findings: Secondary | ICD-10-CM

## 2017-07-26 DIAGNOSIS — Z1239 Encounter for other screening for malignant neoplasm of breast: Secondary | ICD-10-CM

## 2017-07-26 DIAGNOSIS — Z1231 Encounter for screening mammogram for malignant neoplasm of breast: Secondary | ICD-10-CM

## 2017-07-26 DIAGNOSIS — Z1211 Encounter for screening for malignant neoplasm of colon: Secondary | ICD-10-CM

## 2017-07-26 NOTE — Patient Instructions (Signed)
1.  Pap smear is not done.  Next Pap smear is due 2021. 2.  Mammogram is ordered 3.  Stool guaiac card testing for colon cancer screening is ordered 4.  Screening labs are to be obtained through primary care 5.  Continue with healthy eating and exercise 6.  Continue using vaginal lubricants as needed 7.  Continue taking calcium with vitamin D supplementation daily 8.  Return in 1 year for annual exam   Health Maintenance for Postmenopausal Women Menopause is a normal process in which your reproductive ability comes to an end. This process happens gradually over a span of months to years, usually between the ages of 10 and 77. Menopause is complete when you have missed 12 consecutive menstrual periods. It is important to talk with your health care provider about some of the most common conditions that affect postmenopausal women, such as heart disease, cancer, and bone loss (osteoporosis). Adopting a healthy lifestyle and getting preventive care can help to promote your health and wellness. Those actions can also lower your chances of developing some of these common conditions. What should I know about menopause? During menopause, you may experience a number of symptoms, such as:  Moderate-to-severe hot flashes.  Night sweats.  Decrease in sex drive.  Mood swings.  Headaches.  Tiredness.  Irritability.  Memory problems.  Insomnia.  Choosing to treat or not to treat menopausal changes is an individual decision that you make with your health care provider. What should I know about hormone replacement therapy and supplements? Hormone therapy products are effective for treating symptoms that are associated with menopause, such as hot flashes and night sweats. Hormone replacement carries certain risks, especially as you become older. If you are thinking about using estrogen or estrogen with progestin treatments, discuss the benefits and risks with your health care provider. What should  I know about heart disease and stroke? Heart disease, heart attack, and stroke become more likely as you age. This may be due, in part, to the hormonal changes that your body experiences during menopause. These can affect how your body processes dietary fats, triglycerides, and cholesterol. Heart attack and stroke are both medical emergencies. There are many things that you can do to help prevent heart disease and stroke:  Have your blood pressure checked at least every 1-2 years. High blood pressure causes heart disease and increases the risk of stroke.  If you are 67-59 years old, ask your health care provider if you should take aspirin to prevent a heart attack or a stroke.  Do not use any tobacco products, including cigarettes, chewing tobacco, or electronic cigarettes. If you need help quitting, ask your health care provider.  It is important to eat a healthy diet and maintain a healthy weight. ? Be sure to include plenty of vegetables, fruits, low-fat dairy products, and lean protein. ? Avoid eating foods that are high in solid fats, added sugars, or salt (sodium).  Get regular exercise. This is one of the most important things that you can do for your health. ? Try to exercise for at least 150 minutes each week. The type of exercise that you do should increase your heart rate and make you sweat. This is known as moderate-intensity exercise. ? Try to do strengthening exercises at least twice each week. Do these in addition to the moderate-intensity exercise.  Know your numbers.Ask your health care provider to check your cholesterol and your blood glucose. Continue to have your blood tested as directed by  your health care provider.  What should I know about cancer screening? There are several types of cancer. Take the following steps to reduce your risk and to catch any cancer development as early as possible. Breast Cancer  Practice breast self-awareness. ? This means understanding  how your breasts normally appear and feel. ? It also means doing regular breast self-exams. Let your health care provider know about any changes, no matter how small.  If you are 94 or older, have a clinician do a breast exam (clinical breast exam or CBE) every year. Depending on your age, family history, and medical history, it may be recommended that you also have a yearly breast X-ray (mammogram).  If you have a family history of breast cancer, talk with your health care provider about genetic screening.  If you are at high risk for breast cancer, talk with your health care provider about having an MRI and a mammogram every year.  Breast cancer (BRCA) gene test is recommended for women who have family members with BRCA-related cancers. Results of the assessment will determine the need for genetic counseling and BRCA1 and for BRCA2 testing. BRCA-related cancers include these types: ? Breast. This occurs in males or females. ? Ovarian. ? Tubal. This may also be called fallopian tube cancer. ? Cancer of the abdominal or pelvic lining (peritoneal cancer). ? Prostate. ? Pancreatic.  Cervical, Uterine, and Ovarian Cancer Your health care provider may recommend that you be screened regularly for cancer of the pelvic organs. These include your ovaries, uterus, and vagina. This screening involves a pelvic exam, which includes checking for microscopic changes to the surface of your cervix (Pap test).  For women ages 21-65, health care providers may recommend a pelvic exam and a Pap test every three years. For women ages 61-65, they may recommend the Pap test and pelvic exam, combined with testing for human papilloma virus (HPV), every five years. Some types of HPV increase your risk of cervical cancer. Testing for HPV may also be done on women of any age who have unclear Pap test results.  Other health care providers may not recommend any screening for nonpregnant women who are considered low risk for  pelvic cancer and have no symptoms. Ask your health care provider if a screening pelvic exam is right for you.  If you have had past treatment for cervical cancer or a condition that could lead to cancer, you need Pap tests and screening for cancer for at least 20 years after your treatment. If Pap tests have been discontinued for you, your risk factors (such as having a new sexual partner) need to be reassessed to determine if you should start having screenings again. Some women have medical problems that increase the chance of getting cervical cancer. In these cases, your health care provider may recommend that you have screening and Pap tests more often.  If you have a family history of uterine cancer or ovarian cancer, talk with your health care provider about genetic screening.  If you have vaginal bleeding after reaching menopause, tell your health care provider.  There are currently no reliable tests available to screen for ovarian cancer.  Lung Cancer Lung cancer screening is recommended for adults 64-26 years old who are at high risk for lung cancer because of a history of smoking. A yearly low-dose CT scan of the lungs is recommended if you:  Currently smoke.  Have a history of at least 30 pack-years of smoking and you currently smoke or  have quit within the past 15 years. A pack-year is smoking an average of one pack of cigarettes per day for one year.  Yearly screening should:  Continue until it has been 15 years since you quit.  Stop if you develop a health problem that would prevent you from having lung cancer treatment.  Colorectal Cancer  This type of cancer can be detected and can often be prevented.  Routine colorectal cancer screening usually begins at age 56 and continues through age 70.  If you have risk factors for colon cancer, your health care provider may recommend that you be screened at an earlier age.  If you have a family history of colorectal cancer, talk  with your health care provider about genetic screening.  Your health care provider may also recommend using home test kits to check for hidden blood in your stool.  A small camera at the end of a tube can be used to examine your colon directly (sigmoidoscopy or colonoscopy). This is done to check for the earliest forms of colorectal cancer.  Direct examination of the colon should be repeated every 5-10 years until age 33. However, if early forms of precancerous polyps or small growths are found or if you have a family history or genetic risk for colorectal cancer, you may need to be screened more often.  Skin Cancer  Check your skin from head to toe regularly.  Monitor any moles. Be sure to tell your health care provider: ? About any new moles or changes in moles, especially if there is a change in a mole's shape or color. ? If you have a mole that is larger than the size of a pencil eraser.  If any of your family members has a history of skin cancer, especially at a young age, talk with your health care provider about genetic screening.  Always use sunscreen. Apply sunscreen liberally and repeatedly throughout the day.  Whenever you are outside, protect yourself by wearing long sleeves, pants, a wide-brimmed hat, and sunglasses.  What should I know about osteoporosis? Osteoporosis is a condition in which bone destruction happens more quickly than new bone creation. After menopause, you may be at an increased risk for osteoporosis. To help prevent osteoporosis or the bone fractures that can happen because of osteoporosis, the following is recommended:  If you are 27-29 years old, get at least 1,000 mg of calcium and at least 600 mg of vitamin D per day.  If you are older than age 33 but younger than age 31, get at least 1,200 mg of calcium and at least 600 mg of vitamin D per day.  If you are older than age 36, get at least 1,200 mg of calcium and at least 800 mg of vitamin D per  day.  Smoking and excessive alcohol intake increase the risk of osteoporosis. Eat foods that are rich in calcium and vitamin D, and do weight-bearing exercises several times each week as directed by your health care provider. What should I know about how menopause affects my mental health? Depression may occur at any age, but it is more common as you become older. Common symptoms of depression include:  Low or sad mood.  Changes in sleep patterns.  Changes in appetite or eating patterns.  Feeling an overall lack of motivation or enjoyment of activities that you previously enjoyed.  Frequent crying spells.  Talk with your health care provider if you think that you are experiencing depression. What should I know  about immunizations? It is important that you get and maintain your immunizations. These include:  Tetanus, diphtheria, and pertussis (Tdap) booster vaccine.  Influenza every year before the flu season begins.  Pneumonia vaccine.  Shingles vaccine.  Your health care provider may also recommend other immunizations. This information is not intended to replace advice given to you by your health care provider. Make sure you discuss any questions you have with your health care provider. Document Released: 02/19/2005 Document Revised: 07/18/2015 Document Reviewed: 10/01/2014 Elsevier Interactive Patient Education  2018 [employer].

## 2017-08-12 LAB — FECAL OCCULT BLOOD, IMMUNOCHEMICAL: FECAL OCCULT BLD: NEGATIVE

## 2017-08-29 ENCOUNTER — Ambulatory Visit
Admission: RE | Admit: 2017-08-29 | Discharge: 2017-08-29 | Disposition: A | Discharge status: Home / Self Care | Payer: BC Managed Care – PPO | Source: Ambulatory Visit | Attending: Obstetrics and Gynecology | Admitting: Obstetrics and Gynecology

## 2017-08-29 DIAGNOSIS — Z1231 Encounter for screening mammogram for malignant neoplasm of breast: Secondary | ICD-10-CM | POA: Insufficient documentation

## 2017-08-29 DIAGNOSIS — Z1239 Encounter for other screening for malignant neoplasm of breast: Secondary | ICD-10-CM

## 2018-07-15 IMAGING — MR MR CERVICAL SPINE W/O CM
5 series · 32 of 48 positions shown · non-contrast
Comparison: None.

CLINICAL DATA: Numbness in the left hand starting in September 2015.

EXAM:
MRI CERVICAL SPINE WITHOUT CONTRAST
TECHNIQUE: Multiplanar, multisequence MR imaging of the cervical spine was
performed. No intravenous contrast was administered.

[Series 3: T2 · sagittal · 3.0mm · 0.70mm/px · 6 of 13 slices shown (1 of 2)]
[im 1/13]
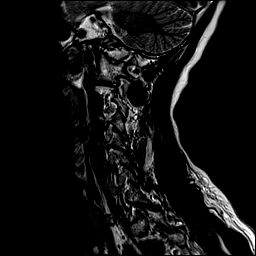
[im 3/13]
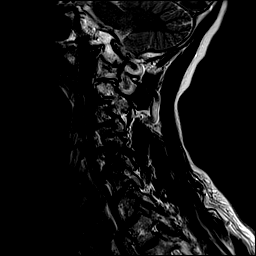
[im 5/13]
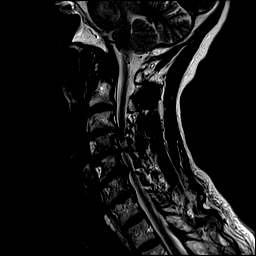
[im 8/13]
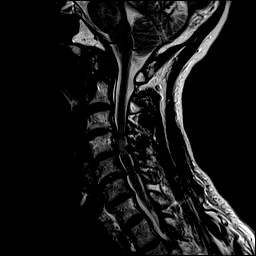
[im 10/13]
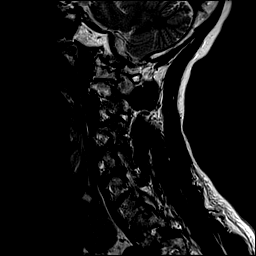
[im 13/13]
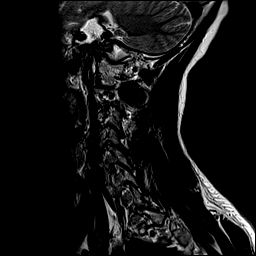

[Series 4: T1 · sagittal · 3.0mm · 0.70mm/px · 7 of 13 slices shown]
[im 1/13]
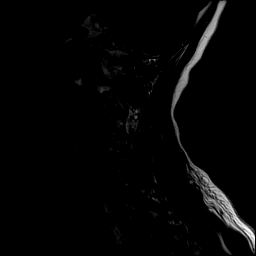
[im 3/13]
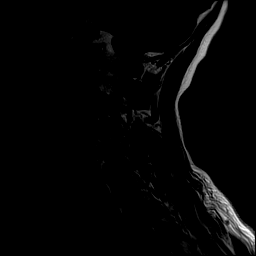
[im 5/13]
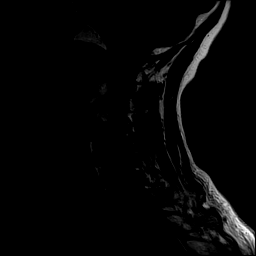
[im 7/13]
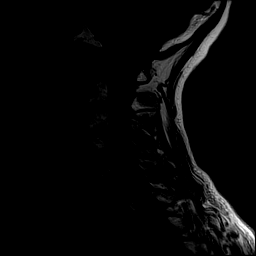
[im 9/13]
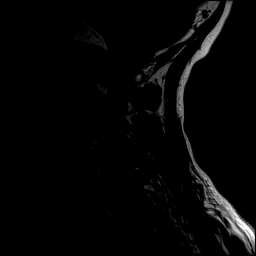
[im 11/13]
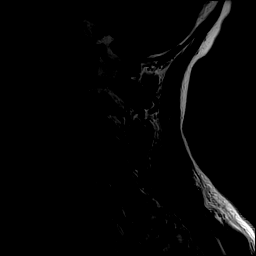
[im 13/13]
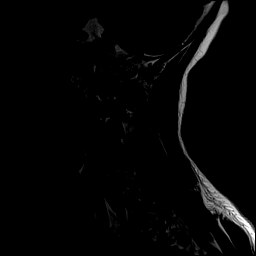

[Series 5: STIR · sagittal · 3.0mm · 0.35mm/px · 7 of 13 slices shown]
[im 1/13]
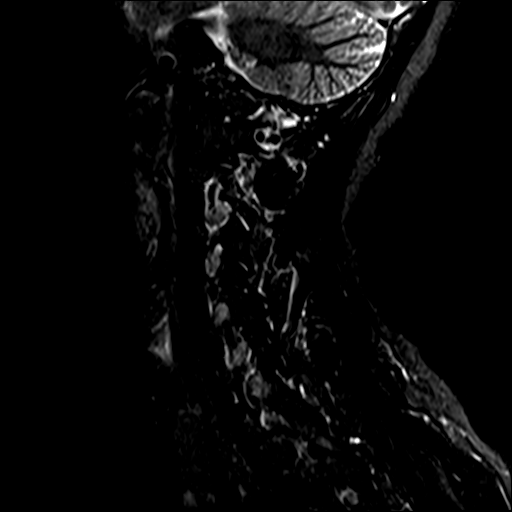
[im 3/13]
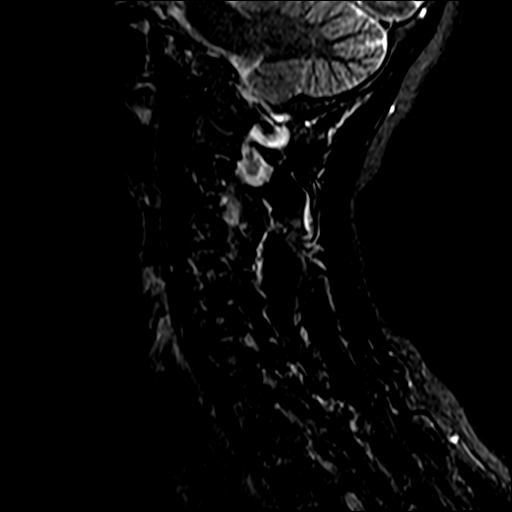
[im 5/13]
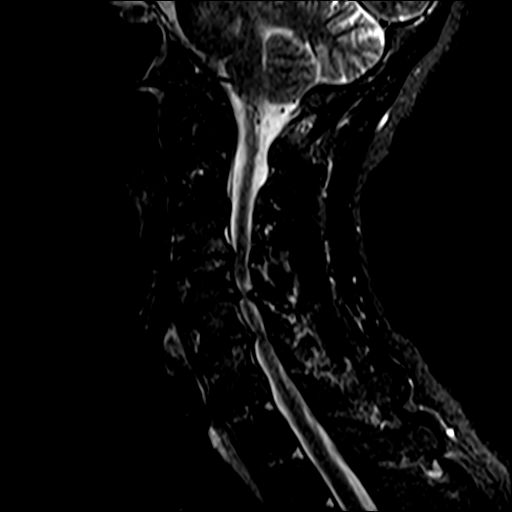
[im 7/13]
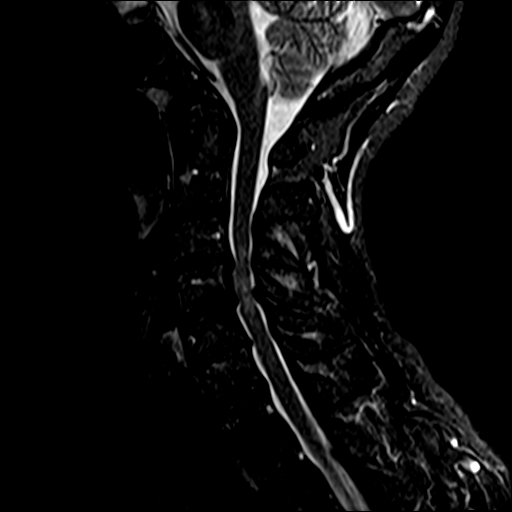
[im 9/13]
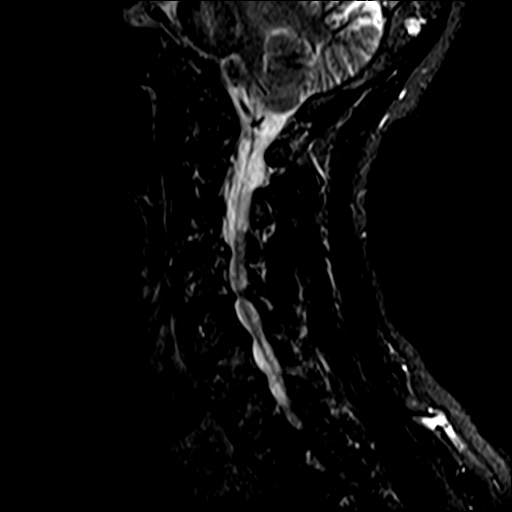
[im 11/13]
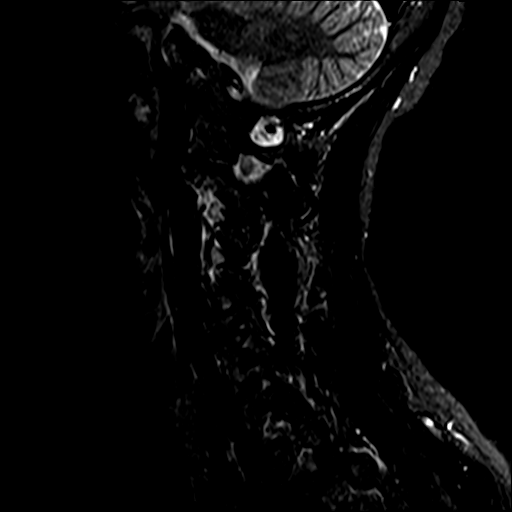
[im 13/13]
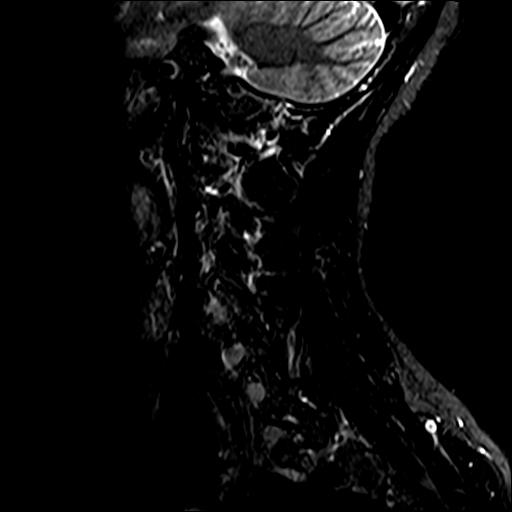

[Series 6: T2 · axial · 3.0mm · 0.70mm/px · z∈[-40,+56]mm · 8 of 27 slices shown (2 of 2)]
[im 1/27]
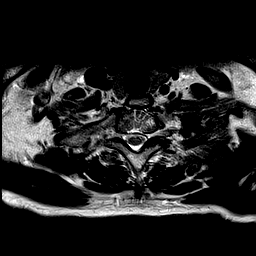
[im 5/27]
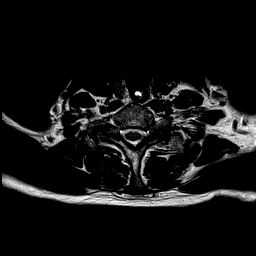
[im 9/27]
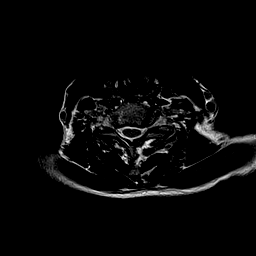
[im 13/27]
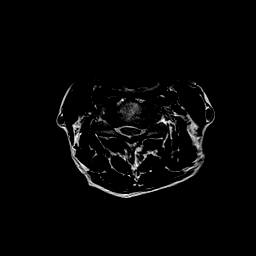
[im 15/27]
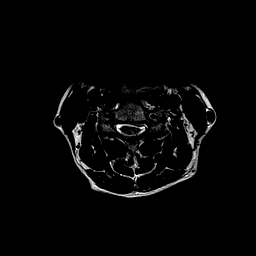
[im 19/27]
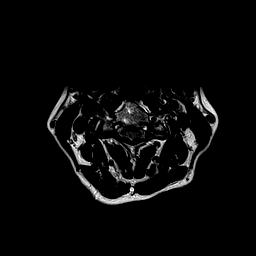
[im 23/27]
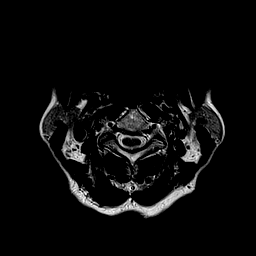
[im 27/27]
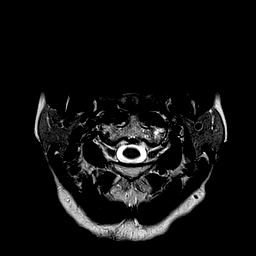

[Series 7: mpgr ax · axial · 3.0mm · 0.35mm/px · z∈[-40,+5]mm · 4 of 27 slices shown]
[im 1/27]
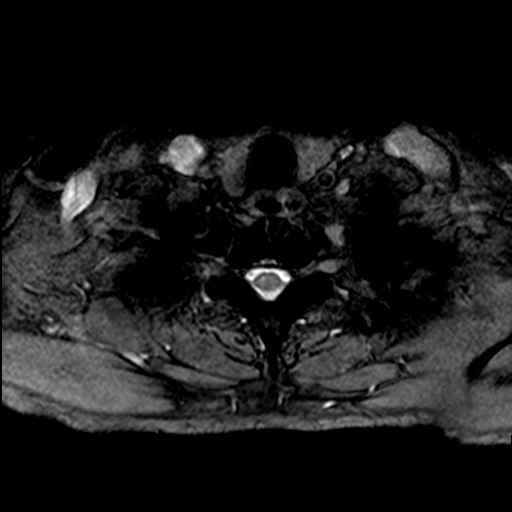
[im 5/27]
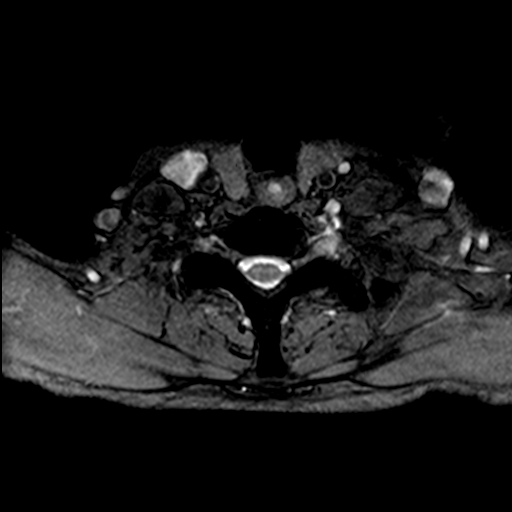
[im 9/27]
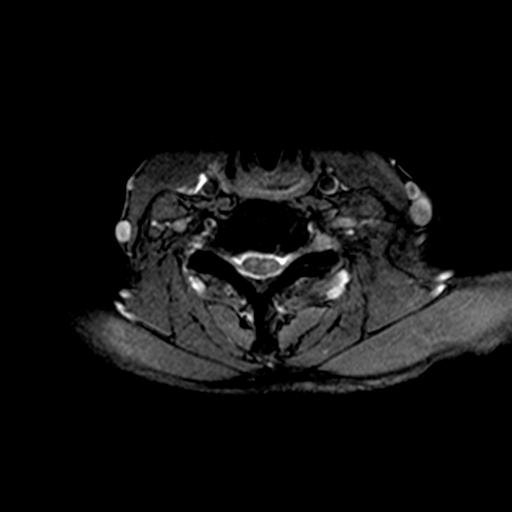
[im 13/27]
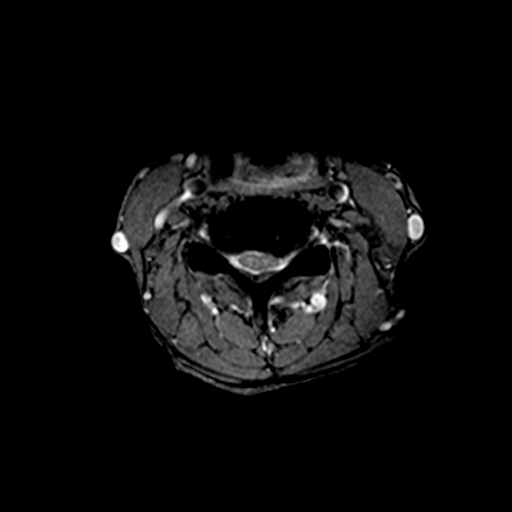

[32 of 48 positions shown; findings below may reference images not displayed]

FINDINGS: Alignment: No vertebral subluxation is observed.

Vertebrae: Disc desiccation at all levels in the cervical spine with
loss of disc height most especially at C3-4, C4-5, C5-6, and C6-7.
Multilevel degenerative endplate findings.

Cord: Abnormally accentuated T2 signal centrally in the cord at the
C4-5 level in a somewhat diffuse an indistinct pattern but slightly
eccentric to the left. Lesser level of accentuated T2 signal in the
cord at C3-4. I suspect that the appearance is due to edema or
myelomalacia related to the central narrowing of the thecal sac at
both of these levels. The core does not appear particularly expanded
and I do not see a syrinx.

Posterior Fossa, vertebral arteries, paraspinal tissues: Accentuated
T2 signal in the pons centrally, favoring chronic ischemic
microvascular white matter disease.

Expected vertebral artery flow signal.

Disc levels:

C2-3: Mild central narrowing of the thecal sac and borderline
bilateral foraminal stenosis due to disc bulge, short pedicles, and
facet and uncinate spurring.

C3-4: Prominent central narrowing of the thecal sac with prominent
bilateral foraminal stenosis due to central disc protrusion,
considerable uncinate spurring, and mild facet arthropathy. AP
diameter of the thecal sac 6 mm.

C4-5: Prominent central narrowing of the thecal sac with prominent
bilateral foraminal stenosis due to posterior osseous ridging,
chronic disc bulge, and facet and uncinate spurring. AP diameter of
the thecal sac 6 mm.

C5-6: Moderate to prominent bilateral foraminal stenosis due to
bilateral uncinate spurring. Moderate central narrowing of the
thecal sac due to disc osteophyte complex.

C6-7: Moderate left and mild-to-moderate right foraminal stenosis
and mild central narrowing of the thecal sac due to disc osteophyte
complex and uncinate spurring.

C7-T1: Mild to moderate bilateral foraminal stenosis due to facet
arthropathy.

T1-2: Mild left foraminal stenosis due to facet arthropathy. This
level is only included on the parasagittal images.
IMPRESSION: 1. Cervical spondylosis and degenerative disc disease causing
prominent impingement at C3-4 and C4-5; moderate to prominent
impingement at C5-6 ; moderate impingement at C6-7 ; mild to
moderate impingement at C7-T1; and mild impingement at C2-3 and
T1-2, as detailed above.
2. Cord edema or myelomalacia in the cervical cord extending from
the C3-4 through the C4-5 levels, thought to be related to the
central narrowing of the thecal sac.
3. Evidence of chronic microvascular white matter disease centrally
in the pons.

## 2018-07-28 ENCOUNTER — Ambulatory Visit (INDEPENDENT_AMBULATORY_CARE_PROVIDER_SITE_OTHER): Payer: BC Managed Care – PPO | Admitting: Obstetrics and Gynecology

## 2018-07-28 ENCOUNTER — Encounter: Payer: Self-pay | Admitting: Obstetrics and Gynecology

## 2018-07-28 ENCOUNTER — Other Ambulatory Visit: Payer: Self-pay

## 2018-07-28 ENCOUNTER — Encounter: Payer: BC Managed Care – PPO | Admitting: Obstetrics and Gynecology

## 2018-07-28 VITALS — BP 144/82 | HR 84 | Ht 67.0 in | Wt 117.8 lb

## 2018-07-28 DIAGNOSIS — Z1239 Encounter for other screening for malignant neoplasm of breast: Secondary | ICD-10-CM

## 2018-07-28 DIAGNOSIS — Z01419 Encounter for gynecological examination (general) (routine) without abnormal findings: Secondary | ICD-10-CM | POA: Diagnosis not present

## 2018-07-28 NOTE — Progress Notes (Signed)
HPI:      [name] is a 63 y.o. G1P1001 who LMP was No LMP recorded. Patient is postmenopausal.  Subjective:   She presents today for her annual examination.  She has no complaints.  Denies any problems.  She remains sexually active and occasionally uses lubricants with intercourse. She gets her blood work through her primary care physician.    Hx: The following portions of the patient's history were reviewed and updated as appropriate:             She  has a past medical history of Cortical cataract, DDD (degenerative disc disease), cervical, DDD (degenerative disc disease), cervical, Diverticulitis, Hyperlipemia, Migraines, Pituitary adenoma ([place]), PONV (postoperative nausea and vomiting), and Vitamin D deficiency. She does not have any pertinent problems on file. She  has a past surgical history that includes Cholecystectomy; Tubal ligation; Degenerative Disc; Eye surgery; Colonoscopy with propofol (N/A, 12/04/2014); Anterior cervical decomp/discectomy fusion (Left, 03/08/2016); and Total hip arthroplasty (Right, 04/12/2016). Her family history includes Arthritis in her mother; Cataracts in her father; Diabetes in her maternal uncle; Hypertension in her mother; Stomach cancer in her maternal uncle and mother. She  reports that she has never smoked. She has never used smokeless tobacco. She reports current alcohol use. She reports that she does not use drugs. She has a current medication list which includes the following prescription(s): calcium carb-cholecalciferol and multivitamin with minerals. She is allergic to acetaminophen-codeine; codeine; ivp dye [iodinated diagnostic agents]; and other.       Review of Systems:  Review of Systems  Constitutional: Denied constitutional symptoms, night sweats, recent illness, fatigue, fever, insomnia and weight loss.  Eyes: Denied eye symptoms, eye pain, photophobia, vision change and visual disturbance.  Ears/Nose/Throat/Neck: Denied ear,  nose, throat or neck symptoms, hearing loss, nasal discharge, sinus congestion and sore throat.  Cardiovascular: Denied cardiovascular symptoms, arrhythmia, chest pain/pressure, edema, exercise intolerance, orthopnea and palpitations.  Respiratory: Denied pulmonary symptoms, asthma, pleuritic pain, productive sputum, cough, dyspnea and wheezing.  Gastrointestinal: Denied, gastro-esophageal reflux, melena, nausea and vomiting.  Genitourinary: Denied genitourinary symptoms including symptomatic vaginal discharge, pelvic relaxation issues, and urinary complaints.  Musculoskeletal: Denied musculoskeletal symptoms, stiffness, swelling, muscle weakness and myalgia.  Dermatologic: Denied dermatology symptoms, rash and scar.  Neurologic: Denied neurology symptoms, dizziness, headache, neck pain and syncope.  Psychiatric: Denied psychiatric symptoms, anxiety and depression.  Endocrine: Denied endocrine symptoms including hot flashes and night sweats.   Meds:   Current Outpatient Medications on File Prior to Visit  Medication Sig Dispense Refill  . Calcium Carb-Cholecalciferol (CALTRATE 600+D) 600-800 MG-UNIT TABS Take 1 tablet by mouth daily.    . Multiple Vitamin (MULTIVITAMIN WITH MINERALS) TABS tablet Take 1 tablet by mouth daily. Centrum silver     No current facility-administered medications on file prior to visit.     Objective:     Vitals:   07/28/18 0910  BP: (!) 144/82  Pulse: 84              Physical examination General NAD, Conversant  HEENT Atraumatic; Op clear with mmm.  Normo-cephalic. Pupils reactive. Anicteric sclerae  Thyroid/Neck Smooth without nodularity or enlargement. Normal ROM.  Neck Supple.  Skin No rashes, lesions or ulceration. Normal palpated skin turgor. No nodularity.  Breasts: No masses or discharge.  Symmetric.  No axillary adenopathy.  Lungs: Clear to auscultation.No rales or wheezes. Normal Respiratory effort, no retractions.  Heart: NSR.  No murmurs or  rubs appreciated. No periferal edema  Abdomen:  Soft.  Non-tender.  No masses.  No HSM. No hernia  Extremities: Moves all appropriately.  Normal ROM for age. No lymphadenopathy.  Neuro: Oriented to PPT.  Normal mood. Normal affect.     Pelvic:   Vulva: Normal appearance.  No lesions.  Vagina: No lesions or abnormalities noted.  Moderate vaginal atrophy noted  Support: Normal pelvic support.  Urethra No masses tenderness or scarring.  Meatus Normal size without lesions or prolapse.  Cervix: Normal appearance.  No lesions.  Anus: Normal exam.  No lesions.  Perineum: Normal exam.  No lesions.        Bimanual   Uterus: Normal size.  Non-tender.  Mobile.  AV.  Adnexae: No masses.  Non-tender to palpation.  Cul-de-sac: Negative for abnormality.      Assessment:    G1P1001 Patient Active Problem List   Diagnosis Date Noted  . S/P total hip arthroplasty 04/12/2016  . Cervical myelopathy ([place]) 03/08/2016  . Primary osteoarthritis of right hip 02/14/2016  . Menopause 07/23/2015  . Vaginal atrophy 07/23/2015     1. Well woman exam   2. Screening for breast cancer     Normal exam   Plan:            1.  Basic Screening Recommendations The basic screening recommendations for asymptomatic women were discussed with the patient during her visit.  The age-appropriate recommendations were discussed with her and the rational for the tests reviewed.  When I am informed by the patient that another primary care physician has previously obtained the age-appropriate tests and they are up-to-date, only outstanding tests are ordered and referrals given as necessary.  Abnormal results of tests will be discussed with her when all of her results are completed. Mammogram ordered-Pap smear next year Orders Orders Placed This Encounter  Procedures  . MM DIGITAL SCREENING BILATERAL    No orders of the defined types were placed in this encounter.       F/U  Return in about 1 year (around 07/28/2019)  for Annual Physical.  Finis Bud, M.D. 07/28/2018 9:31 AM

## 2018-07-28 NOTE — Progress Notes (Signed)
Patient comes in today for her yearly physical. She has no concerns. She has labs through PCP. Mammogram due in August.

## 2018-08-07 ENCOUNTER — Other Ambulatory Visit: Payer: Self-pay | Admitting: Family Medicine

## 2018-08-07 ENCOUNTER — Other Ambulatory Visit: Payer: Self-pay | Admitting: Obstetrics and Gynecology

## 2018-08-07 ENCOUNTER — Other Ambulatory Visit: Payer: Self-pay | Admitting: Internal Medicine

## 2018-08-07 DIAGNOSIS — Z1231 Encounter for screening mammogram for malignant neoplasm of breast: Secondary | ICD-10-CM

## 2018-09-08 ENCOUNTER — Ambulatory Visit
Admission: RE | Admit: 2018-09-08 | Discharge: 2018-09-08 | Disposition: A | Discharge status: Home / Self Care | Payer: BC Managed Care – PPO | Source: Ambulatory Visit | Attending: Obstetrics and Gynecology | Admitting: Obstetrics and Gynecology

## 2018-09-08 DIAGNOSIS — Z1231 Encounter for screening mammogram for malignant neoplasm of breast: Secondary | ICD-10-CM | POA: Diagnosis present

## 2019-07-31 ENCOUNTER — Encounter: Payer: BC Managed Care – PPO | Admitting: Obstetrics and Gynecology

## 2019-07-31 ENCOUNTER — Other Ambulatory Visit (HOSPITAL_COMMUNITY)
Admission: RE | Admit: 2019-07-31 | Discharge: 2019-07-31 | Disposition: A | Discharge status: Home / Self Care | Payer: BC Managed Care – PPO | Source: Ambulatory Visit | Attending: Obstetrics and Gynecology | Admitting: Obstetrics and Gynecology

## 2019-07-31 ENCOUNTER — Encounter: Payer: Self-pay | Admitting: Obstetrics and Gynecology

## 2019-07-31 ENCOUNTER — Ambulatory Visit (INDEPENDENT_AMBULATORY_CARE_PROVIDER_SITE_OTHER): Payer: BC Managed Care – PPO | Admitting: Obstetrics and Gynecology

## 2019-07-31 VITALS — BP 184/75 | HR 73 | Ht 67.0 in | Wt 119.2 lb

## 2019-07-31 DIAGNOSIS — Z01419 Encounter for gynecological examination (general) (routine) without abnormal findings: Secondary | ICD-10-CM | POA: Diagnosis not present

## 2019-07-31 DIAGNOSIS — Z124 Encounter for screening for malignant neoplasm of cervix: Secondary | ICD-10-CM | POA: Insufficient documentation

## 2019-07-31 DIAGNOSIS — Z1231 Encounter for screening mammogram for malignant neoplasm of breast: Secondary | ICD-10-CM | POA: Diagnosis not present

## 2019-07-31 NOTE — Addendum Note (Signed)
Addended by: Durwin Glaze on: 07/31/2019 03:19 PM   Modules accepted: Orders

## 2019-07-31 NOTE — Progress Notes (Signed)
HPI:      [name] is a 64 y.o. G1P1001 who LMP was No LMP recorded. Patient is postmenopausal.  Subjective:   She presents today for her annual examination.  She has no complaints.  She is sexually active and occasional but rarely uses lubricants.  Has no complaints. She is due for her mammogram in August.    Hx: The following portions of the patient's history were reviewed and updated as appropriate:             She  has a past medical history of Cortical cataract, DDD (degenerative disc disease), cervical, DDD (degenerative disc disease), cervical, Diverticulitis, Hyperlipemia, Migraines, Pituitary adenoma ([place]), PONV (postoperative nausea and vomiting), and Vitamin D deficiency. She does not have any pertinent problems on file. She  has a past surgical history that includes Cholecystectomy; Tubal ligation; Degenerative Disc; Eye surgery; Colonoscopy with propofol (N/A, 12/04/2014); Anterior cervical decomp/discectomy fusion (Left, 03/08/2016); and Total hip arthroplasty (Right, 04/12/2016). Her family history includes Arthritis in her mother; Cataracts in her father; Diabetes in her maternal uncle; Hypertension in her mother; Stomach cancer in her maternal uncle and mother. She  reports that she has never smoked. She has never used smokeless tobacco. She reports current alcohol use. She reports that she does not use drugs. She has a current medication list which includes the following prescription(s): calcium carb-cholecalciferol and multivitamin with minerals. She is allergic to acetaminophen-codeine, codeine, ivp dye [iodinated diagnostic agents], and other.       Review of Systems:  Review of Systems  Constitutional: Denied constitutional symptoms, night sweats, recent illness, fatigue, fever, insomnia and weight loss.  Eyes: Denied eye symptoms, eye pain, photophobia, vision change and visual disturbance.  Ears/Nose/Throat/Neck: Denied ear, nose, throat or neck symptoms, hearing  loss, nasal discharge, sinus congestion and sore throat.  Cardiovascular: Denied cardiovascular symptoms, arrhythmia, chest pain/pressure, edema, exercise intolerance, orthopnea and palpitations.  Respiratory: Denied pulmonary symptoms, asthma, pleuritic pain, productive sputum, cough, dyspnea and wheezing.  Gastrointestinal: Denied, gastro-esophageal reflux, melena, nausea and vomiting.  Genitourinary: Denied genitourinary symptoms including symptomatic vaginal discharge, pelvic relaxation issues, and urinary complaints.  Musculoskeletal: Denied musculoskeletal symptoms, stiffness, swelling, muscle weakness and myalgia.  Dermatologic: Denied dermatology symptoms, rash and scar.  Neurologic: Denied neurology symptoms, dizziness, headache, neck pain and syncope.  Psychiatric: Denied psychiatric symptoms, anxiety and depression.  Endocrine: Denied endocrine symptoms including hot flashes and night sweats.   Meds:   Current Outpatient Medications on File Prior to Visit  Medication Sig Dispense Refill  . Calcium Carb-Cholecalciferol (CALTRATE 600+D) 600-800 MG-UNIT TABS Take 1 tablet by mouth daily.    . Multiple Vitamin (MULTIVITAMIN WITH MINERALS) TABS tablet Take 1 tablet by mouth daily. Centrum silver     No current facility-administered medications on file prior to visit.    Objective:     Vitals:   07/31/19 1335  BP: (!) 184/75  Pulse: 73              Physical examination General NAD, Conversant  HEENT Atraumatic; Op clear with mmm.  Normo-cephalic. Pupils reactive. Anicteric sclerae  Thyroid/Neck Smooth without nodularity or enlargement. Normal ROM.  Neck Supple.  Skin No rashes, lesions or ulceration. Normal palpated skin turgor. No nodularity.  Breasts: No masses or discharge.  Symmetric.  No axillary adenopathy.  Lungs: Clear to auscultation.No rales or wheezes. Normal Respiratory effort, no retractions.  Heart: NSR.  No murmurs or rubs appreciated. No periferal edema   Abdomen: Soft.  Non-tender.  No masses.  No HSM. No hernia  Extremities: Moves all appropriately.  Normal ROM for age. No lymphadenopathy.  Neuro: Oriented to PPT.  Normal mood. Normal affect.     Pelvic:   Vulva: Normal appearance.  No lesions.  Vagina: No lesions or abnormalities noted.  Mild vaginal atrophy  Support: Normal pelvic support.  Urethra No masses tenderness or scarring.  Meatus Normal size without lesions or prolapse.  Cervix: Normal appearance.  No lesions.  Anus: Normal exam.  No lesions.  Perineum: Normal exam.  No lesions.        Bimanual   Uterus: Normal size.  Non-tender.  Mobile.  Retroverted  Adnexae: No masses.  Non-tender to palpation.  Cul-de-sac: Negative for abnormality.      Assessment:    G1P1001 Patient Active Problem List   Diagnosis Date Noted  . S/P total hip arthroplasty 04/12/2016  . Cervical myelopathy ([place]) 03/08/2016  . Primary osteoarthritis of right hip 02/14/2016  . Menopause 07/23/2015  . Vaginal atrophy 07/23/2015     1. Well woman exam with routine gynecological exam   2. Encounter for screening mammogram for malignant neoplasm of breast     Normal exam   Plan:            1.  Basic Screening Recommendations The basic screening recommendations for asymptomatic women were discussed with the patient during her visit.  The age-appropriate recommendations were discussed with her and the rational for the tests reviewed.  When I am informed by the patient that another primary care physician has previously obtained the age-appropriate tests and they are up-to-date, only outstanding tests are ordered and referrals given as necessary.  Abnormal results of tests will be discussed with her when all of her results are completed.  Routine preventative health maintenance measures emphasized: Exercise/Diet/Weight control, Tobacco Warnings, Alcohol/Substance use risks and Stress Management Pap performed-mammogram ordered. We discussed vaginal  atrophy at this time patient needs no further evaluation or treatment. Orders Orders Placed This Encounter  Procedures  . MM 3D SCREEN BREAST BILATERAL    No orders of the defined types were placed in this encounter.       F/U  No follow-ups on file.  Finis Bud, M.D. 07/31/2019 2:03 PM

## 2019-08-03 LAB — CYTOLOGY - PAP
Comment: NEGATIVE
Comment: NEGATIVE
Comment: NEGATIVE
Diagnosis: UNDETERMINED — AB
HPV 16: NEGATIVE
HPV 18 / 45: NEGATIVE
High risk HPV: POSITIVE — AB

## 2019-08-14 ENCOUNTER — Other Ambulatory Visit (HOSPITAL_COMMUNITY)
Admission: RE | Admit: 2019-08-14 | Discharge: 2019-08-14 | Disposition: A | Discharge status: Home / Self Care | Payer: BC Managed Care – PPO | Source: Ambulatory Visit | Attending: Obstetrics and Gynecology | Admitting: Obstetrics and Gynecology

## 2019-08-14 ENCOUNTER — Encounter: Payer: Self-pay | Admitting: Obstetrics and Gynecology

## 2019-08-14 ENCOUNTER — Ambulatory Visit (INDEPENDENT_AMBULATORY_CARE_PROVIDER_SITE_OTHER): Payer: BC Managed Care – PPO | Admitting: Obstetrics and Gynecology

## 2019-08-14 VITALS — BP 187/72 | HR 68 | Ht 67.0 in | Wt 120.3 lb

## 2019-08-14 DIAGNOSIS — R87610 Atypical squamous cells of undetermined significance on cytologic smear of cervix (ASC-US): Secondary | ICD-10-CM | POA: Diagnosis present

## 2019-08-14 DIAGNOSIS — N72 Inflammatory disease of cervix uteri: Secondary | ICD-10-CM | POA: Diagnosis present

## 2019-08-14 DIAGNOSIS — B977 Papillomavirus as the cause of diseases classified elsewhere: Secondary | ICD-10-CM | POA: Diagnosis present

## 2019-08-14 NOTE — Addendum Note (Signed)
Addended by: Durwin Glaze on: 08/14/2019 10:11 AM   Modules accepted: Orders

## 2019-08-14 NOTE — Progress Notes (Signed)
Referring Provider:    HPI:  Morgan Lam is a 64 y.o.  G1P1001  who presents today for evaluation and management of abnormal cervical cytology.    Dysplasia History:  ASCUS    Pos HPV  She states this is the first abnormal Pap smear she can remember.  She reports no previous procedures on her cervix.  ROS:  Pertinent items noted in HPI and remainder of comprehensive ROS otherwise negative.  OB History  Gravida Para Term Preterm AB Living  1 1 1     1   SAB TAB Ectopic Multiple Live Births          1    # Outcome Date GA Lbr Len/2nd Weight Sex Delivery Anes PTL Lv  1 Term 1991   7 lb (3.175 kg) M VBAC   LIV    Past Medical History:  Diagnosis Date  . Cortical cataract   . DDD (degenerative disc disease), cervical   . DDD (degenerative disc disease), cervical   . Diverticulitis   . Hyperlipemia   . Migraines    history of migraines  . Pituitary adenoma ([place])   . PONV (postoperative nausea and vomiting)   . Vitamin D deficiency     Past Surgical History:  Procedure Laterality Date  . ANTERIOR CERVICAL DECOMP/DISCECTOMY FUSION Left 03/08/2016   Procedure: ANTERIOR CERVICAL DECOMPRESSION/DISCECTOMY FUSION 3 LEVELS;  Surgeon: Deetta Perla, MD;  Location: [facility] ORS;  Service: Neurosurgery;  Laterality: Left;  . CHOLECYSTECTOMY    . COLONOSCOPY WITH PROPOFOL N/A 12/04/2014   Procedure: COLONOSCOPY WITH PROPOFOL;  Surgeon: Manya Silvas, MD;  Location: [facility] ENDOSCOPY;  Service: Endoscopy;  Laterality: N/A;  . Degenerative Disc    . EYE SURGERY     cataract os  . 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 TUBAL LIGATION      SOCIAL HISTORY:  Social History   Substance and Sexual Activity  Alcohol Use Yes  . Alcohol/week: 0.0 - 2.0 standard drinks   Comment: occasionally- only wine    Social History   Substance and Sexual Activity  Drug  Use No     Family History  Problem Relation Age of Onset  . Diabetes Maternal Uncle   . Stomach cancer Maternal Uncle   . Hypertension Mother   . Arthritis Mother   . Stomach cancer Mother   . Cataracts Father   . Cancer Neg Hx   . Breast cancer Neg Hx   . Ovarian cancer Neg Hx   . Colon cancer Neg Hx     ALLERGIES:  Acetaminophen-codeine, Codeine, Ivp dye [iodinated diagnostic agents], and Other  She has a current medication list which includes the following prescription(s): calcium carb-cholecalciferol and multivitamin with minerals.  Physical Exam: -Vitals:  BP (!) 187/72   Pulse 68   Ht 5\' 7"  (1.702 m)   Wt 120 lb 4.8 oz (54.6 kg)   BMI 18.84 kg/m   PROCEDURE: Colposcopy performed with 4% acetic acid after verbal consent obtained                           -Aceto-white Lesions Location(s): 12 o'clock.              -Biopsy performed at 12 o'clock               -ECC  indicated and performed: No.     -Biopsy sites made hemostatic with pressure and Monsel's solution   -Satisfactory colposcopy: Yes.      -Evidence of Invasive cervical CA :  NO  ASSESSMENT:  Morgan Lam is a 64 y.o. G1P1001 here for  1. Atypical squamous cells of undetermined significance on cytologic smear of cervix (ASC-US)   2. High risk human papilloma virus (HPV) infection of cervix   .  PLAN: 1.  I discussed the grading system of pap smears and HPV high risk viral types.  We will discuss management after colpo results return.  No orders of the defined types were placed in this encounter.          F/U  Return in about 2 weeks (around 08/28/2019) for Colpo f/u.  Jeannie Fend ,MD 08/14/2019,8:43 AM

## 2019-08-15 LAB — SURGICAL PATHOLOGY

## 2019-08-29 ENCOUNTER — Ambulatory Visit: Payer: BC Managed Care – PPO | Admitting: Obstetrics and Gynecology

## 2019-08-29 ENCOUNTER — Other Ambulatory Visit: Payer: Self-pay

## 2019-08-29 ENCOUNTER — Encounter: Payer: Self-pay | Admitting: Obstetrics and Gynecology

## 2019-08-29 VITALS — BP 164/91 | HR 70 | Ht 67.0 in | Wt 118.9 lb

## 2019-08-29 DIAGNOSIS — N870 Mild cervical dysplasia: Secondary | ICD-10-CM | POA: Diagnosis not present

## 2019-08-29 NOTE — Progress Notes (Signed)
HPI:      [name] is a 64 y.o. G1P1001 who LMP was No LMP recorded. Patient is postmenopausal.  Subjective:   She presents today for follow-up of her colposcopy.  Previous Pap smear showed ASCUS with positive HPV.    Hx: The following portions of the patient's history were reviewed and updated as appropriate:             She  has a past medical history of Cortical cataract, DDD (degenerative disc disease), cervical, DDD (degenerative disc disease), cervical, Diverticulitis, Hyperlipemia, Migraines, Pituitary adenoma ([place]), PONV (postoperative nausea and vomiting), and Vitamin D deficiency. She does not have any pertinent problems on file. She  has a past surgical history that includes Cholecystectomy; Tubal ligation; Degenerative Disc; Eye surgery; Colonoscopy with propofol (N/A, 12/04/2014); Anterior cervical decomp/discectomy fusion (Left, 03/08/2016); and Total hip arthroplasty (Right, 04/12/2016). Her family history includes Arthritis in her mother; Cataracts in her father; Diabetes in her maternal uncle; Hypertension in her mother; Stomach cancer in her maternal uncle and mother. She  reports that she has never smoked. She has never used smokeless tobacco. She reports current alcohol use. She reports that she does not use drugs. She has a current medication list which includes the following prescription(s): calcium carb-cholecalciferol and multivitamin with minerals. She is allergic to acetaminophen-codeine, codeine, ivp dye [iodinated diagnostic agents], and other.       Review of Systems:  Review of Systems  Constitutional: Denied constitutional symptoms, night sweats, recent illness, fatigue, fever, insomnia and weight loss.  Eyes: Denied eye symptoms, eye pain, photophobia, vision change and visual disturbance.  Ears/Nose/Throat/Neck: Denied ear, nose, throat or neck symptoms, hearing loss, nasal discharge, sinus congestion and sore throat.  Cardiovascular: Denied  cardiovascular symptoms, arrhythmia, chest pain/pressure, edema, exercise intolerance, orthopnea and palpitations.  Respiratory: Denied pulmonary symptoms, asthma, pleuritic pain, productive sputum, cough, dyspnea and wheezing.  Gastrointestinal: Denied, gastro-esophageal reflux, melena, nausea and vomiting.  Genitourinary: Denied genitourinary symptoms including symptomatic vaginal discharge, pelvic relaxation issues, and urinary complaints.  Musculoskeletal: Denied musculoskeletal symptoms, stiffness, swelling, muscle weakness and myalgia.  Dermatologic: Denied dermatology symptoms, rash and scar.  Neurologic: Denied neurology symptoms, dizziness, headache, neck pain and syncope.  Psychiatric: Denied psychiatric symptoms, anxiety and depression.  Endocrine: Denied endocrine symptoms including hot flashes and night sweats.   Meds:   Current Outpatient Medications on File Prior to Visit  Medication Sig Dispense Refill  . Calcium Carb-Cholecalciferol (CALTRATE 600+D) 600-800 MG-UNIT TABS Take 1 tablet by mouth daily.    . Multiple Vitamin (MULTIVITAMIN WITH MINERALS) TABS tablet Take 1 tablet by mouth daily. Centrum silver     No current facility-administered medications on file prior to visit.    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 S/P total hip arthroplasty 04/12/2016  . Cervical myelopathy ([place]) 03/08/2016  . Primary osteoarthritis of right hip 02/14/2016  . Menopause 07/23/2015  . Vaginal atrophy 07/23/2015     1. CIN I (cervical intraepithelial neoplasia I)        Plan:            1.  We discussed the natural course and history of abnormal Pap smears and its relationship to human papilloma virus.  CIN-1 was specifically  discussed. I recommended a follow-up Pap cotest in 1 year.  I have answered all  of her questions. Orders No orders of the defined types were placed in this encounter.   No orders of the defined types were placed in this encounter.     F/U  Return in about 1 year (around 08/28/2020). I spent 14 minutes involved in the care of this patient preparing to see the patient by obtaining and reviewing her medical history (including labs, imaging tests and prior procedures), documenting clinical information in the electronic health record (EHR), counseling and coordinating care plans, writing and sending prescriptions, ordering tests or procedures and directly communicating with the patient by discussing pertinent items from her history and physical exam as well as detailing my assessment and plan as noted above so that she has an informed understanding.  All of her questions were answered.  Finis Bud, M.D. 08/29/2019 3:32 PM

## 2019-09-10 ENCOUNTER — Ambulatory Visit
Admission: RE | Admit: 2019-09-10 | Discharge: 2019-09-10 | Disposition: A | Discharge status: Home / Self Care | Payer: BC Managed Care – PPO | Source: Ambulatory Visit | Attending: Obstetrics and Gynecology | Admitting: Obstetrics and Gynecology

## 2019-09-10 ENCOUNTER — Other Ambulatory Visit: Payer: Self-pay

## 2019-09-10 DIAGNOSIS — Z1231 Encounter for screening mammogram for malignant neoplasm of breast: Secondary | ICD-10-CM | POA: Insufficient documentation

## 2020-08-26 ENCOUNTER — Other Ambulatory Visit: Payer: Self-pay | Admitting: Family Medicine

## 2020-08-26 DIAGNOSIS — Z1231 Encounter for screening mammogram for malignant neoplasm of breast: Secondary | ICD-10-CM

## 2020-09-04 ENCOUNTER — Encounter: Payer: Self-pay | Admitting: Obstetrics and Gynecology

## 2020-09-04 ENCOUNTER — Encounter: Payer: BC Managed Care – PPO | Admitting: Obstetrics and Gynecology

## 2020-09-04 ENCOUNTER — Other Ambulatory Visit: Payer: Self-pay

## 2020-09-04 ENCOUNTER — Other Ambulatory Visit (HOSPITAL_COMMUNITY)
Admission: RE | Admit: 2020-09-04 | Discharge: 2020-09-04 | Disposition: A | Discharge status: Home / Self Care | Payer: Self-pay | Source: Ambulatory Visit | Attending: Obstetrics and Gynecology | Admitting: Obstetrics and Gynecology

## 2020-09-04 ENCOUNTER — Ambulatory Visit (INDEPENDENT_AMBULATORY_CARE_PROVIDER_SITE_OTHER): Payer: Medicare PPO | Admitting: Obstetrics and Gynecology

## 2020-09-04 VITALS — BP 191/83 | HR 83 | Ht 67.0 in | Wt 113.5 lb

## 2020-09-04 DIAGNOSIS — Z01419 Encounter for gynecological examination (general) (routine) without abnormal findings: Secondary | ICD-10-CM

## 2020-09-04 DIAGNOSIS — N72 Inflammatory disease of cervix uteri: Secondary | ICD-10-CM | POA: Diagnosis not present

## 2020-09-04 DIAGNOSIS — B977 Papillomavirus as the cause of diseases classified elsewhere: Secondary | ICD-10-CM

## 2020-09-04 DIAGNOSIS — N870 Mild cervical dysplasia: Secondary | ICD-10-CM | POA: Insufficient documentation

## 2020-09-04 NOTE — Progress Notes (Signed)
HPI:      [name] is a 65 y.o. G1P1001 who LMP was No LMP recorded. Patient is postmenopausal.  Subjective:   She presents today for her annual examination.  She has no GYN complaints. She continues to take supplemental calcium daily.  She has osteoarthritis of her right hip. Of significant note she was found to have CIN-1 by colposcopically directed biopsies 1 year ago.  She is due today for a Pap smear Co. test.   Hx: The following portions of the patient's history were reviewed and updated as appropriate:             She  has a past medical history of Cortical cataract, DDD (degenerative disc disease), cervical, DDD (degenerative disc disease), cervical, Diverticulitis, Hyperlipemia, Migraines, Pituitary adenoma ([place]), PONV (postoperative nausea and vomiting), and Vitamin D deficiency. She does not have any pertinent problems on file. She  has a past surgical history that includes Cholecystectomy; Tubal ligation; Degenerative Disc; Eye surgery; Colonoscopy with propofol (N/A, 12/04/2014); Anterior cervical decomp/discectomy fusion (Left, 03/08/2016); and Total hip arthroplasty (Right, 04/12/2016). Her family history includes Arthritis in her mother; Cataracts in her father; Diabetes in her maternal uncle; Hypertension in her mother; Stomach cancer in her maternal uncle and mother. She  reports that she has never smoked. She has never used smokeless tobacco. She reports current alcohol use. She reports that she does not use drugs. She has a current medication list which includes the following prescription(s): calcium carb-cholecalciferol and multivitamin with minerals. She is allergic to acetaminophen-codeine, codeine, ivp dye [iodinated diagnostic agents], and other.       Review of Systems:  Review of Systems  Constitutional: Denied constitutional symptoms, night sweats, recent illness, fatigue, fever, insomnia and weight loss.  Eyes: Denied eye symptoms, eye pain, photophobia,  vision change and visual disturbance.  Ears/Nose/Throat/Neck: Denied ear, nose, throat or neck symptoms, hearing loss, nasal discharge, sinus congestion and sore throat.  Cardiovascular: Denied cardiovascular symptoms, arrhythmia, chest pain/pressure, edema, exercise intolerance, orthopnea and palpitations.  Respiratory: Denied pulmonary symptoms, asthma, pleuritic pain, productive sputum, cough, dyspnea and wheezing.  Gastrointestinal: Denied, gastro-esophageal reflux, melena, nausea and vomiting.  Genitourinary: Denied genitourinary symptoms including symptomatic vaginal discharge, pelvic relaxation issues, and urinary complaints.  Musculoskeletal: Denied musculoskeletal symptoms, stiffness, swelling, muscle weakness and myalgia.  Dermatologic: Denied dermatology symptoms, rash and scar.  Neurologic: Denied neurology symptoms, dizziness, headache, neck pain and syncope.  Psychiatric: Denied psychiatric symptoms, anxiety and depression.  Endocrine: Denied endocrine symptoms including hot flashes and night sweats.   Meds:   Current Outpatient Medications on File Prior to Visit  Medication Sig Dispense Refill   Calcium Carb-Cholecalciferol (CALTRATE 600+D) 600-800 MG-UNIT TABS Take 1 tablet by mouth daily.     Multiple Vitamin (MULTIVITAMIN WITH MINERALS) TABS tablet Take 1 tablet by mouth daily. Centrum silver     No current facility-administered medications on file prior to visit.       Objective:     Vitals:   09/04/20 1351  BP: (!) 191/83  Pulse: 83    Filed Weights   09/04/20 1351  Weight: 113 lb 8 oz (51.5 kg)            Physical examination General NAD, Conversant  HEENT Atraumatic; Op clear with mmm.  Normo-cephalic. Pupils reactive. Anicteric sclerae  Thyroid/Neck Smooth without nodularity or enlargement. Normal ROM.  Neck Supple.  Skin No rashes, lesions or ulceration. Normal palpated skin turgor. No nodularity.  Breasts: No masses or discharge.  Symmetric.  No  axillary adenopathy.  Lungs: Clear to auscultation.No rales or wheezes. Normal Respiratory effort, no retractions.  Heart: NSR.  No murmurs or rubs appreciated. No periferal edema  Abdomen: Soft.  Non-tender.  No masses.  No HSM. No hernia  Extremities: Moves all appropriately.  Normal ROM for age. No lymphadenopathy.  Neuro: Oriented to PPT.  Normal mood. Normal affect.     Pelvic:   Vulva: Normal appearance.  No lesions.  Vagina: No lesions or abnormalities noted.  Moderate atrophy noted  Support: Normal pelvic support.  Urethra No masses tenderness or scarring.  Meatus Normal size without lesions or prolapse.  Cervix: Normal appearance.  No lesions.  Anus: Normal exam.  No lesions.  Perineum: Normal exam.  No lesions.        Bimanual   Uterus: Normal size.  Non-tender.  Mobile.  Retroverted  Adnexae: No masses.  Non-tender to palpation.  Cul-de-sac: Negative for abnormality.     Assessment:    G1P1001 Patient Active Problem List   Diagnosis Date Noted   S/P total hip arthroplasty 04/12/2016   Cervical myelopathy ([place]) 03/08/2016   Primary osteoarthritis of right hip 02/14/2016   Menopause 07/23/2015   Vaginal atrophy 07/23/2015     1. Well woman exam with routine gynecological exam   2. High risk human papilloma virus (HPV) infection of cervix   3. CIN I (cervical intraepithelial neoplasia I)        Plan:            1.  Basic Screening Recommendations The basic screening recommendations for asymptomatic women were discussed with the patient during her visit.  The age-appropriate recommendations were discussed with her and the rational for the tests reviewed.  When I am informed by the patient that another primary care physician has previously obtained the age-appropriate tests and they are up-to-date, only outstanding tests are ordered and referrals given as necessary.  Abnormal results of tests will be discussed with her when all of her results are completed.  Routine  preventative health maintenance measures emphasized: Exercise/Diet/Weight control, Tobacco Warnings, Alcohol/Substance use risks and Stress Management Pap Co-test performed -mammogram scheduled for September -she does annual blood work with her PCP. 2.  Discussed importance of Pap smear follow-up. 3.  Continued use of calcium supplementation. Orders No orders of the defined types were placed in this encounter.   No orders of the defined types were placed in this encounter.         F/U  Return in about 1 year (around 09/04/2021).  Finis Bud, M.D. 09/04/2020 2:25 PM

## 2020-09-09 LAB — CYTOLOGY - PAP
Comment: NEGATIVE
Diagnosis: UNDETERMINED — AB
High risk HPV: POSITIVE — AB

## 2020-09-12 ENCOUNTER — Other Ambulatory Visit: Payer: Self-pay

## 2020-09-12 ENCOUNTER — Ambulatory Visit
Admission: RE | Admit: 2020-09-12 | Discharge: 2020-09-12 | Disposition: A | Discharge status: Home / Self Care | Payer: Medicare PPO | Source: Ambulatory Visit | Attending: Family Medicine | Admitting: Family Medicine

## 2020-09-12 DIAGNOSIS — Z1231 Encounter for screening mammogram for malignant neoplasm of breast: Secondary | ICD-10-CM | POA: Diagnosis not present

## 2021-08-04 ENCOUNTER — Other Ambulatory Visit: Payer: Self-pay | Admitting: Family Medicine

## 2021-08-04 DIAGNOSIS — Z1231 Encounter for screening mammogram for malignant neoplasm of breast: Secondary | ICD-10-CM

## 2021-09-09 ENCOUNTER — Encounter: Payer: Medicare PPO | Admitting: Obstetrics and Gynecology

## 2021-09-15 ENCOUNTER — Ambulatory Visit
Admission: RE | Admit: 2021-09-15 | Discharge: 2021-09-15 | Disposition: A | Discharge status: Home / Self Care | Payer: Medicare PPO | Source: Ambulatory Visit | Attending: Family Medicine | Admitting: Family Medicine

## 2021-09-15 DIAGNOSIS — Z1231 Encounter for screening mammogram for malignant neoplasm of breast: Secondary | ICD-10-CM | POA: Diagnosis not present

## 2021-09-16 ENCOUNTER — Other Ambulatory Visit (HOSPITAL_COMMUNITY)
Admission: RE | Admit: 2021-09-16 | Discharge: 2021-09-16 | Disposition: A | Discharge status: Home / Self Care | Payer: Medicare PPO | Source: Ambulatory Visit | Attending: Obstetrics and Gynecology | Admitting: Obstetrics and Gynecology

## 2021-09-16 ENCOUNTER — Encounter: Payer: Self-pay | Admitting: Obstetrics and Gynecology

## 2021-09-16 ENCOUNTER — Ambulatory Visit: Payer: Medicare PPO | Admitting: Obstetrics and Gynecology

## 2021-09-16 VITALS — BP 158/99 | HR 90 | Ht 67.0 in | Wt 109.2 lb

## 2021-09-16 DIAGNOSIS — Z01419 Encounter for gynecological examination (general) (routine) without abnormal findings: Secondary | ICD-10-CM

## 2021-09-16 DIAGNOSIS — Z124 Encounter for screening for malignant neoplasm of cervix: Secondary | ICD-10-CM | POA: Diagnosis not present

## 2021-09-16 DIAGNOSIS — N870 Mild cervical dysplasia: Secondary | ICD-10-CM | POA: Diagnosis present

## 2021-09-16 DIAGNOSIS — Z8619 Personal history of other infectious and parasitic diseases: Secondary | ICD-10-CM | POA: Insufficient documentation

## 2021-09-16 DIAGNOSIS — B977 Papillomavirus as the cause of diseases classified elsewhere: Secondary | ICD-10-CM

## 2021-09-16 DIAGNOSIS — Z1151 Encounter for screening for human papillomavirus (HPV): Secondary | ICD-10-CM | POA: Diagnosis not present

## 2021-09-16 DIAGNOSIS — N72 Inflammatory disease of cervix uteri: Secondary | ICD-10-CM | POA: Insufficient documentation

## 2021-09-16 NOTE — Progress Notes (Signed)
Patients presents for annual exam today. She states doing well, recently got back in town from vacation. Denies any postmenopausal bleeding. Patient is due for pap smear, ordered. Patient is up to date on mammogram. Annual labs declined. Patient states no other questions or concerns at this time.

## 2021-09-16 NOTE — Progress Notes (Signed)
HPI:      [name] is a 66 y.o. G1P1001 who LMP was No LMP recorded. Patient is postmenopausal.  Subjective:   She presents today for her annual examination.  She has no complaints. Of significant note, she had an ASCUS Pap with positive HPV last year.  This seems to have been an improvement from when she was diagnosed previously with CIN-1. She is up-to-date on mammography    Hx: The following portions of the patient's history were reviewed and updated as appropriate:             She  has a past medical history of Cortical cataract, DDD (degenerative disc disease), cervical, DDD (degenerative disc disease), cervical, Diverticulitis, Hyperlipemia, Migraines, Pituitary adenoma ([place]), PONV (postoperative nausea and vomiting), and Vitamin D deficiency. She does not have any pertinent problems on file. She  has a past surgical history that includes Cholecystectomy; Tubal ligation; Degenerative Disc; Eye surgery; Colonoscopy with propofol (N/A, 12/04/2014); Anterior cervical decomp/discectomy fusion (Left, 03/08/2016); and Total hip arthroplasty (Right, 04/12/2016). Her family history includes Arthritis in her mother; Cataracts in her father; Diabetes in her maternal uncle; Hypertension in her mother; Stomach cancer in her maternal uncle and mother. She  reports that she has never smoked. She has never used smokeless tobacco. She reports current alcohol use. She reports that she does not use drugs. She has a current medication list which includes the following prescription(s): calcium carb-cholecalciferol and multivitamin with minerals. She is allergic to acetaminophen-codeine, codeine, ivp dye [iodinated contrast media], and other.       Review of Systems:  Review of Systems  Constitutional: Denied constitutional symptoms, night sweats, recent illness, fatigue, fever, insomnia and weight loss.  Eyes: Denied eye symptoms, eye pain, photophobia, vision change and visual disturbance.   Ears/Nose/Throat/Neck: Denied ear, nose, throat or neck symptoms, hearing loss, nasal discharge, sinus congestion and sore throat.  Cardiovascular: Denied cardiovascular symptoms, arrhythmia, chest pain/pressure, edema, exercise intolerance, orthopnea and palpitations.  Respiratory: Denied pulmonary symptoms, asthma, pleuritic pain, productive sputum, cough, dyspnea and wheezing.  Gastrointestinal: Denied, gastro-esophageal reflux, melena, nausea and vomiting.  Genitourinary: Denied genitourinary symptoms including symptomatic vaginal discharge, pelvic relaxation issues, and urinary complaints.  Musculoskeletal: Denied musculoskeletal symptoms, stiffness, swelling, muscle weakness and myalgia.  Dermatologic: Denied dermatology symptoms, rash and scar.  Neurologic: Denied neurology symptoms, dizziness, headache, neck pain and syncope.  Psychiatric: Denied psychiatric symptoms, anxiety and depression.  Endocrine: Denied endocrine symptoms including hot flashes and night sweats.   Meds:   Current Outpatient Medications on File Prior to Visit  Medication Sig Dispense Refill   Calcium Carb-Cholecalciferol (CALTRATE 600+D) 600-800 MG-UNIT TABS Take 1 tablet by mouth daily.     Multiple Vitamin (MULTIVITAMIN WITH MINERALS) TABS tablet Take 1 tablet by mouth daily. Centrum silver     No current facility-administered medications on file prior to visit.     Objective:     Vitals:   09/16/21 0813  BP: (!) 158/99  Pulse: 90    Filed Weights   09/16/21 0813  Weight: 109 lb 3.2 oz (49.5 kg)              Physical examination General NAD, Conversant  HEENT Atraumatic; Op clear with mmm.  Normo-cephalic. Pupils reactive. Anicteric sclerae  Thyroid/Neck Smooth without nodularity or enlargement. Normal ROM.  Neck Supple.  Skin No rashes, lesions or ulceration. Normal palpated skin turgor. No nodularity.  Breasts: No masses or discharge.  Symmetric.  No axillary adenopathy.  Lungs:  Clear to  auscultation.No rales or wheezes. Normal Respiratory effort, no retractions.  Heart: NSR.  No murmurs or rubs appreciated. No periferal edema  Abdomen: Soft.  Non-tender.  No masses.  No HSM. No hernia  Extremities: Moves all appropriately.  Normal ROM for age. No lymphadenopathy.  Neuro: Oriented to PPT.  Normal mood. Normal affect.     Pelvic:   Vulva: Normal appearance.  No lesions.  Vagina: No lesions or abnormalities noted.  Support: Normal pelvic support.  Urethra No masses tenderness or scarring.  Meatus Normal size without lesions or prolapse.  Cervix: Almost flush with vaginal vault  Anus: Normal exam.  No lesions.  Perineum: Normal exam.  No lesions.        Bimanual   Uterus: Small-retroverted  Adnexae: No masses.  Non-tender to palpation.  Cul-de-sac: Negative for abnormality.     Assessment:    G1P1001 Patient Active Problem List   Diagnosis Date Noted   S/P total hip arthroplasty 04/12/2016   Cervical myelopathy ([place]) 03/08/2016   Primary osteoarthritis of right hip 02/14/2016   Menopause 07/23/2015   Vaginal atrophy 07/23/2015     1. Well woman exam with routine gynecological exam   2. CIN I (cervical intraepithelial neoplasia I)   3. High risk human papilloma virus (HPV) infection of cervix   4. Cervical cancer screening        Plan:            1.  Basic Screening Recommendations The basic screening recommendations for asymptomatic women were discussed with the patient during her visit.  The age-appropriate recommendations were discussed with her and the rational for the tests reviewed.  When I am informed by the patient that another primary care physician has previously obtained the age-appropriate tests and they are up-to-date, only outstanding tests are ordered and referrals given as necessary.  Abnormal results of tests will be discussed with her when all of her results are completed.  Routine preventative health maintenance measures emphasized:  Exercise/Diet/Weight control, Tobacco Warnings, Alcohol/Substance use risks and Stress Management  Pap performed  Orders No orders of the defined types were placed in this encounter.   No orders of the defined types were placed in this encounter.         F/U  Return in about 1 year (around 09/17/2022) for Annual Physical, We will contact her with any abnormal test results.  Finis Bud, M.D. 09/16/2021 8:36 AM

## 2021-09-18 ENCOUNTER — Other Ambulatory Visit: Payer: Self-pay | Admitting: Family Medicine

## 2021-09-18 DIAGNOSIS — R928 Other abnormal and inconclusive findings on diagnostic imaging of breast: Secondary | ICD-10-CM

## 2021-09-18 DIAGNOSIS — N6489 Other specified disorders of breast: Secondary | ICD-10-CM

## 2021-09-21 LAB — CYTOLOGY - PAP
Comment: NEGATIVE
Diagnosis: NEGATIVE
High risk HPV: NEGATIVE

## 2021-10-08 ENCOUNTER — Ambulatory Visit
Admission: RE | Admit: 2021-10-08 | Discharge: 2021-10-08 | Disposition: A | Discharge status: Home / Self Care | Payer: Medicare PPO | Source: Ambulatory Visit | Attending: Family Medicine | Admitting: Family Medicine

## 2021-10-08 DIAGNOSIS — N6489 Other specified disorders of breast: Secondary | ICD-10-CM | POA: Diagnosis present

## 2021-10-08 DIAGNOSIS — R928 Other abnormal and inconclusive findings on diagnostic imaging of breast: Secondary | ICD-10-CM | POA: Insufficient documentation

## 2022-09-07 ENCOUNTER — Other Ambulatory Visit: Payer: Self-pay | Admitting: Family Medicine

## 2022-09-07 DIAGNOSIS — Z1231 Encounter for screening mammogram for malignant neoplasm of breast: Secondary | ICD-10-CM

## 2022-09-15 ENCOUNTER — Encounter: Payer: Self-pay | Admitting: Obstetrics and Gynecology

## 2022-09-15 ENCOUNTER — Ambulatory Visit: Payer: Medicare PPO | Admitting: Obstetrics and Gynecology

## 2022-09-15 VITALS — BP 176/99 | HR 77 | Ht 67.0 in | Wt 112.4 lb

## 2022-09-15 DIAGNOSIS — Z01419 Encounter for gynecological examination (general) (routine) without abnormal findings: Secondary | ICD-10-CM

## 2022-09-15 DIAGNOSIS — I10 Essential (primary) hypertension: Secondary | ICD-10-CM

## 2022-09-15 NOTE — Progress Notes (Signed)
HPI:      [name] is a 67 y.o. G1P1001 who LMP was No LMP recorded. Patient is postmenopausal.  Subjective:   She presents today for her annual examination.  She says she is doing well and having no issues.  She is scheduled for mammography this week.  She is up-to-date on her Pap smear. As noted below she was found to have significant hypertension.  She reports that this is exactly what happens at her PCPs office.  Every time she goes to the doctor's office her blood pressure is very elevated.  She reports that she has a blood pressure cuff at home and she takes her blood pressure regularly and it is never elevated at home.  She reports that her PCP is satisfied with this result and that they have monitored her home pressures in the past.    Hx: The following portions of the patient's history were reviewed and updated as appropriate:             She  has a past medical history of Cortical cataract, DDD (degenerative disc disease), cervical, DDD (degenerative disc disease), cervical, Diverticulitis, Hyperlipemia, Migraines, Pituitary adenoma ([facility]), PONV (postoperative nausea and vomiting), and Vitamin D deficiency. She does not have any pertinent problems on file. She  has a past surgical history that includes Cholecystectomy; Tubal ligation; Degenerative Disc; Eye surgery; Colonoscopy with propofol (N/A, 12/04/2014); Anterior cervical decomp/discectomy fusion (Left, 03/08/2016); and Total hip arthroplasty (Right, 04/12/2016). Her family history includes Arthritis in her mother; Cataracts in her father; Diabetes in her maternal uncle; Hypertension in her mother; Stomach cancer in her maternal uncle and mother. She  reports that she has never smoked. She has never used smokeless tobacco. She reports current alcohol use. She reports that she does not use drugs. She has a current medication list which includes the following prescription(s): calcium carb-cholecalciferol, multivitamin with  minerals, and turmeric. She is allergic to acetaminophen-codeine, codeine, ivp dye [iodinated contrast media], and other.       Review of Systems:  Review of Systems  Constitutional: Denied constitutional symptoms, night sweats, recent illness, fatigue, fever, insomnia and weight loss.  Eyes: Denied eye symptoms, eye pain, photophobia, vision change and visual disturbance.  Ears/Nose/Throat/Neck: Denied ear, nose, throat or neck symptoms, hearing loss, nasal discharge, sinus congestion and sore throat.  Cardiovascular: Denied cardiovascular symptoms, arrhythmia, chest pain/pressure, edema, exercise intolerance, orthopnea and palpitations.  Respiratory: Denied pulmonary symptoms, asthma, pleuritic pain, productive sputum, cough, dyspnea and wheezing.  Gastrointestinal: Denied, gastro-esophageal reflux, melena, nausea and vomiting.  Genitourinary: Denied genitourinary symptoms including symptomatic vaginal discharge, pelvic relaxation issues, and urinary complaints.  Musculoskeletal: Denied musculoskeletal symptoms, stiffness, swelling, muscle weakness and myalgia.  Dermatologic: Denied dermatology symptoms, rash and scar.  Neurologic: Denied neurology symptoms, dizziness, headache, neck pain and syncope.  Psychiatric: Denied psychiatric symptoms, anxiety and depression.  Endocrine: Denied endocrine symptoms including hot flashes and night sweats.   Meds:   Current Outpatient Medications on File Prior to Visit  Medication Sig Dispense Refill   Calcium Carb-Cholecalciferol (CALTRATE 600+D) 600-800 MG-UNIT TABS Take 1 tablet by mouth daily.     Multiple Vitamin (MULTIVITAMIN WITH MINERALS) TABS tablet Take 1 tablet by mouth daily. Centrum silver     Turmeric (QC TUMERIC COMPLEX PO) Take by mouth.     No current facility-administered medications on file prior to visit.     Objective:     Vitals:   09/15/22 0820 09/15/22 0836  BP: (!) 178/104 Marland Kitchen)  176/99  Pulse: 77     Filed Weights    09/15/22 0820  Weight: 112 lb 6.4 oz (51 kg)              Examination deferred until next year.  Assessment:    G1P1001 Patient Active Problem List   Diagnosis Date Noted   S/P total hip arthroplasty 04/12/2016   Cervical myelopathy ([facility]) 03/08/2016   Primary osteoarthritis of right hip 02/14/2016   Menopause 07/23/2015   Vaginal atrophy 07/23/2015     1. Well woman exam with routine gynecological exam   2. White coat syndrome with diagnosis of hypertension     Patient has elevated blood pressures but reports that she has been down this road before with her PCP and they have monitored her home pressures and are satisfied she does not have hypertension.   Plan:            1.  Basic Screening Recommendations The basic screening recommendations for asymptomatic women were discussed with the patient during her visit.  The age-appropriate recommendations were discussed with her and the rational for the tests reviewed.  When I am informed by the patient that another primary care physician has previously obtained the age-appropriate tests and they are up-to-date, only outstanding tests are ordered and referrals given as necessary.  Abnormal results of tests will be discussed with her when all of her results are completed.  Routine preventative health maintenance measures emphasized: Exercise/Diet/Weight control, Tobacco Warnings, Alcohol/Substance use risks and Stress Management 2.  Discussed hypertension and whitecoat syndrome.  Recommended possibly taking her blood pressure at different locations to make sure that she did not have elevated pressures everywhere but home.  Orders No orders of the defined types were placed in this encounter.   No orders of the defined types were placed in this encounter.         F/U  Return in about 1 year (around 09/15/2023) for Annual Physical.  Elonda Husky, M.D. 09/15/2022 9:00 AM

## 2022-09-15 NOTE — Progress Notes (Signed)
Patients presents for annual exam today. She states doing well other than dealing with inflammation. Up to date on pap smear, negative last year. Due for mammogram, scheduled for 09/17/22. Annual labs are deferred to PCP. She states no other questions or concerns at this time.

## 2022-09-17 ENCOUNTER — Ambulatory Visit
Admission: RE | Admit: 2022-09-17 | Discharge: 2022-09-17 | Disposition: A | Discharge status: Home / Self Care | Payer: Medicare PPO | Source: Ambulatory Visit | Attending: Family Medicine | Admitting: Family Medicine

## 2022-09-17 DIAGNOSIS — Z1231 Encounter for screening mammogram for malignant neoplasm of breast: Secondary | ICD-10-CM | POA: Insufficient documentation

## 2023-08-11 ENCOUNTER — Other Ambulatory Visit: Payer: Self-pay | Admitting: Family Medicine

## 2023-08-11 DIAGNOSIS — Z1231 Encounter for screening mammogram for malignant neoplasm of breast: Secondary | ICD-10-CM

## 2023-09-20 ENCOUNTER — Ambulatory Visit
Admission: RE | Admit: 2023-09-20 | Discharge: 2023-09-20 | Disposition: A | Discharge status: Home / Self Care | Source: Ambulatory Visit | Attending: Family Medicine | Admitting: Family Medicine

## 2023-09-20 DIAGNOSIS — Z1231 Encounter for screening mammogram for malignant neoplasm of breast: Secondary | ICD-10-CM | POA: Insufficient documentation

## 2023-10-04 ENCOUNTER — Ambulatory Visit (INDEPENDENT_AMBULATORY_CARE_PROVIDER_SITE_OTHER): Admitting: Obstetrics & Gynecology

## 2023-10-04 ENCOUNTER — Other Ambulatory Visit (HOSPITAL_COMMUNITY)
Admission: RE | Admit: 2023-10-04 | Discharge: 2023-10-04 | Disposition: A | Discharge status: Home / Self Care | Source: Ambulatory Visit | Attending: Obstetrics & Gynecology | Admitting: Obstetrics & Gynecology

## 2023-10-04 ENCOUNTER — Encounter: Payer: Self-pay | Admitting: Obstetrics & Gynecology

## 2023-10-04 VITALS — BP 134/64 | HR 73 | Ht 66.75 in | Wt 115.5 lb

## 2023-10-04 DIAGNOSIS — Z01419 Encounter for gynecological examination (general) (routine) without abnormal findings: Secondary | ICD-10-CM | POA: Insufficient documentation

## 2023-10-04 DIAGNOSIS — I10 Essential (primary) hypertension: Secondary | ICD-10-CM

## 2023-10-04 DIAGNOSIS — Z124 Encounter for screening for malignant neoplasm of cervix: Secondary | ICD-10-CM | POA: Insufficient documentation

## 2023-10-04 NOTE — Progress Notes (Signed)
 ANNUAL PREVENTATIVE CARE GYNECOLOGY  ENCOUNTER NOTE  Subjective:       Morgan Lam is a 68 y.o. monogamous G25P1001 (son 38 yo, 2 grands) here for a routine annual gynecologic exam. The patient is not currently sexually active. The patient is not taking hormone replacement therapy. Patient denies post-menopausal vaginal bleeding. The patient wears seatbelts: yes. The patient participates in regular exercise: yes. (Walking) Has the patient ever been transfused or tattooed?: no. The patient reports that there is not domestic violence in her life. Menopause in her 82s.  Current complaints: 1.  She denies any current gyn issues.     Gynecologic History No LMP recorded. Patient is postmenopausal.  Last Pap: 2023. Results were: normal, h/o ASCUS and + HR HPV in 2021 and 2022. Last mammogram: 2025. Results were: normal Last Colonoscopy: due in about 2 years Last Dexa Scan: Her primary care provider will manage this.  Obstetric History OB History  Gravida Para Term Preterm AB Living  1 1 1   1   SAB IAB Ectopic Multiple Live Births      1    # Outcome Date GA Lbr Len/2nd Weight Sex Type Anes PTL Lv  1 Term 1991   7 lb (3.175 kg) M Vag-Spont   LIV    Past Medical History:  Diagnosis Date   Cortical cataract    DDD (degenerative disc disease), cervical    DDD (degenerative disc disease), cervical    Diverticulitis    Hyperlipemia    Migraines    history of migraines   Pituitary adenoma ([facility])    PONV (postoperative nausea and vomiting)    Vitamin D  deficiency     Family History  Problem Relation Age of Onset   Diabetes Maternal Uncle    Stomach cancer Maternal Uncle    Hypertension Mother    Arthritis Mother    Stomach cancer Mother    Cataracts Father    Cancer Neg Hx    Breast cancer Neg Hx    Ovarian cancer Neg Hx    Colon cancer Neg Hx     Past Surgical History:  Procedure Laterality Date   ANTERIOR CERVICAL DECOMP/DISCECTOMY FUSION Left 03/08/2016    Procedure: ANTERIOR CERVICAL DECOMPRESSION/DISCECTOMY FUSION 3 LEVELS;  Surgeon: Elspeth Ahle, MD;  Location: [facility] ORS;  Service: Neurosurgery;  Laterality: Left;   CHOLECYSTECTOMY     COLONOSCOPY WITH PROPOFOL  N/A 12/04/2014   Procedure: COLONOSCOPY WITH PROPOFOL ;  Surgeon: Lamar ONEIDA Holmes, MD;  Location: [facility] ENDOSCOPY;  Service: Endoscopy;  Laterality: N/A;   Degenerative Disc     EYE SURGERY     cataract os   TOTAL HIP ARTHROPLASTY Right 04/12/2016   Procedure: TOTAL HIP ARTHROPLASTY;  Surgeon: Lynwood SHAUNNA Hue, MD;  Location: [facility] ORS;  Service: Orthopedics;  Laterality: Right;   TUBAL LIGATION      Social History   Socioeconomic History   Marital status: Single    Spouse name: Not on file   Number of children: Not on file   Years of education: Not on file   Highest education level: Not on file  Occupational History   Not on file  Tobacco Use   Smoking status: Never   Smokeless tobacco: Never  Vaping Use   Vaping status: Never Used  Substance and Sexual Activity   Alcohol use: Yes    Alcohol/week: 0.0 - 2.0 standard drinks of alcohol    Comment: occasionally- only wine   Drug use: No   Sexual activity:  Not Currently    Birth control/protection: Post-menopausal, Surgical    Comment: BTL  Other Topics Concern   Not on file  Social History Narrative   Lives at home with husband. Independent at baseline   Social Drivers of Health   Financial Resource Strain: Patient Declined (11/15/2022)   Received from [facility]   Overall Financial Resource Strain (CARDIA)    Difficulty of Paying Living Expenses: Patient declined  Food Insecurity: Patient Declined (11/15/2022)   Received [facility]   Hunger Vital Sign    Within the past 12 months, you worried that your food would run out before you got the money to buy more.: Patient declined    Within the past 12 months, the food you bought just didn't last and you didn't have money to get  more.: Patient declined  Transportation Needs: Patient Declined (11/15/2022)   Received from [facility] - Transportation    In the past 12 months, has lack of transportation kept you from medical appointments or from getting medications?: Patient declined    Lack of Transportation (Non-Medical): Patient declined  Physical Activity: Insufficiently Active (07/26/2017)   Exercise Vital Sign    Days of Exercise per Week: 4 days    Minutes of Exercise per Session: 30 min  Stress: Not on file  Social Connections: Not on file  Intimate Partner Violence: Not on file    Current Outpatient Medications on File Prior to Visit  Medication Sig Dispense Refill   amLODipine (NORVASC) 5 MG tablet Take 5 mg by mouth daily.     Calcium  Carb-Cholecalciferol (CALTRATE 600+D) 600-800 MG-UNIT TABS Take 1 tablet by mouth daily.     latanoprost (XALATAN) 0.005 % ophthalmic solution 1 drop at bedtime.     Multiple Vitamin (MULTIVITAMIN WITH MINERALS) TABS tablet Take 1 tablet by mouth daily. Centrum silver     No current facility-administered medications on file prior to visit.    Allergies  Allergen Reactions   Acetaminophen -Codeine Nausea Only    Tylenol  #3   Codeine Nausea Only   Ivp Dye [Iodinated Contrast Media] Hives    Topical betadine pt is not allergic to.   Other     Anesthesia-nausea/vomiting        Review of Systems ROS Review of Systems - General ROS: negative for - chills, fatigue, fever, hot flashes, night sweats, weight gain or weight loss Psychological ROS: negative for - anxiety, decreased libido, depression, mood swings, physical abuse or sexual abuse Ophthalmic ROS: negative for - blurry vision, eye pain or loss of vision ENT ROS: negative for - headaches, hearing change, visual changes or vocal changes Allergy and Immunology ROS: negative for - hives, itchy/watery eyes or seasonal allergies Hematological and Lymphatic ROS: negative for - bleeding  problems, bruising, swollen lymph nodes or weight loss Endocrine ROS: negative for - galactorrhea, hair pattern changes, hot flashes, malaise/lethargy, mood swings, palpitations, polydipsia/polyuria, skin changes, temperature intolerance or unexpected weight changes Breast ROS: negative for - new or changing breast lumps or nipple discharge Respiratory ROS: negative for - cough or shortness of breath Cardiovascular ROS: negative for - chest pain, irregular heartbeat, palpitations or shortness of breath Gastrointestinal ROS: no abdominal pain, change in bowel habits, or black or bloody stools Genito-Urinary ROS: no dysuria, trouble voiding, or hematuria Musculoskeletal ROS: negative for - joint pain or joint stiffness Neurological ROS: negative for - bowel and bladder control changes Dermatological ROS: negative for rash and  skin lesion changes   Objective:   BP 134/64   Pulse 73   Ht 5' 6.75 (1.695 m)   Wt 115 lb 8 oz (52.4 kg)   BMI 18.23 kg/m  CONSTITUTIONAL: Well-developed, well-nourished female in no acute distress.  PSYCHIATRIC: Normal mood and affect. Normal behavior. Normal judgment and thought content. NEUROLGIC: Alert and oriented to person, place, and time. Normal muscle tone coordination. No cranial nerve deficit noted. HENT:  Normocephalic, atraumatic, External right and left ear normal. Oropharynx is clear and moist EYES: Conjunctivae and EOM are normal. Pupils are equal, round, and reactive to light. No scleral icterus.  NECK: Normal range of motion, supple, no masses.  Normal thyroid.  SKIN: Skin is warm and dry. No rash noted. Not diaphoretic. No erythema. No pallor. CARDIOVASCULAR: Normal heart rate noted, regular rhythm, no murmur. RESPIRATORY: Clear to auscultation bilaterally. Effort and breath sounds normal, no problems with respiration noted. BREASTS: Symmetric in size. No masses, skin changes, nipple drainage, or lymphadenopathy. ABDOMEN: Soft, normal bowel  sounds, no distention noted.  No tenderness, rebound or guarding.  EG: atrophy, normal Pederson speculum placed, cervix appears normal normal size and shape, anteverted uterus, normal adnexal exam MUSCULOSKELETAL: Normal range of motion. No tenderness.  No cyanosis, clubbing, or edema.  2+ distal pulses. LYMPHATIC: No Axillary, Supraclavicular, or Inguinal Adenopathy.   Labs: Lab Results  Component Value Date   WBC 12.8 (H) 04/14/2016   HGB 9.3 (L) 04/14/2016   HCT 27.9 (L) 04/14/2016   MCV 86.2 04/14/2016   PLT 165 04/14/2016    Lab Results  Component Value Date   CREATININE 0.71 04/14/2016   BUN 6 04/14/2016   NA 138 04/14/2016   K 3.2 (L) 04/14/2016   CL 106 04/14/2016   CO2 25 04/14/2016    Lab Results  Component Value Date   ALT 16 03/31/2016   AST 26 03/31/2016   ALKPHOS 65 03/31/2016   BILITOT 0.8 03/31/2016       Assessment:   1. Well woman exam with routine gynecological exam   2. White coat syndrome with diagnosis of hypertension   3. Screening for cervical cancer      Plan:  Pap: done due to her h/o + HR HPV and ASCUS in 2022 SVE monthly rec'd Return to Clinic - 1 Year   Harland JAYSON Birkenhead, MD [facility]

## 2023-10-04 NOTE — Addendum Note (Signed)
 Addended by: STARLA HARLAND BROCKS on: 10/04/2023 01:30 PM   Modules accepted: Orders

## 2023-10-04 NOTE — Addendum Note (Signed)
 Addended by: ALAINA BIRMINGHAM R on: 10/04/2023 10:05 AM   Modules accepted: Orders

## 2023-10-07 LAB — CYTOLOGY - PAP
Adequacy: ABSENT
Comment: NEGATIVE
Comment: NEGATIVE
Comment: NEGATIVE
Diagnosis: NEGATIVE
HPV 16: NEGATIVE
HPV 18 / 45: NEGATIVE
High risk HPV: POSITIVE — AB

## 2023-10-10 ENCOUNTER — Encounter: Payer: Self-pay | Admitting: Obstetrics & Gynecology

## 2024-02-03 ENCOUNTER — Encounter: Payer: Self-pay | Admitting: Ophthalmology

## 2024-02-07 ENCOUNTER — Encounter: Payer: Self-pay | Admitting: Anesthesiology

## 2024-02-07 ENCOUNTER — Other Ambulatory Visit: Payer: Self-pay

## 2024-02-07 ENCOUNTER — Encounter
Admission: RE | Disposition: A | Discharge status: Home / Self Care | Payer: Self-pay | Source: Home / Self Care | Attending: Ophthalmology

## 2024-02-07 ENCOUNTER — Ambulatory Visit: Admission: RE | Admit: 2024-02-07 | Source: Home / Self Care | Admitting: Ophthalmology

## 2024-02-07 DIAGNOSIS — H2511 Age-related nuclear cataract, right eye: Secondary | ICD-10-CM | POA: Diagnosis present

## 2024-02-07 DIAGNOSIS — I10 Essential (primary) hypertension: Secondary | ICD-10-CM | POA: Diagnosis not present

## 2024-02-07 HISTORY — DX: Unspecified osteoarthritis, unspecified site: M19.90

## 2024-02-07 HISTORY — DX: Cardiac murmur, unspecified: R01.1

## 2024-02-07 HISTORY — DX: Essential (primary) hypertension: I10

## 2024-02-07 MED ORDER — FENTANYL CITRATE (PF) 100 MCG/2ML IJ SOLN
INTRAMUSCULAR | Status: AC
  Filled 2024-02-07: qty 2

## 2024-02-07 MED ORDER — CYCLOPENTOLATE HCL 2 % OP SOLN
OPHTHALMIC | Status: AC
  Filled 2024-02-07: qty 2

## 2024-02-07 MED ORDER — TETRACAINE HCL 0.5 % OP SOLN
1.0000 [drp] | OPHTHALMIC | Status: DC | PRN
  Administered 2024-02-07 (×3): 1 [drp] via OPHTHALMIC

## 2024-02-07 MED ORDER — PHENYLEPHRINE HCL 10 % OP SOLN
OPHTHALMIC | Status: AC
  Filled 2024-02-07: qty 5

## 2024-02-07 MED ORDER — BRIMONIDINE TARTRATE-TIMOLOL 0.2-0.5 % OP SOLN
OPHTHALMIC | Status: DC | PRN
  Administered 2024-02-07: 1 [drp] via OPHTHALMIC

## 2024-02-07 MED ORDER — MIDAZOLAM HCL (PF) 2 MG/2ML IJ SOLN
INTRAMUSCULAR | Status: DC | PRN
  Administered 2024-02-07: 2 mg via INTRAVENOUS

## 2024-02-07 MED ORDER — CYCLOPENTOLATE HCL 2 % OP SOLN
1.0000 [drp] | OPHTHALMIC | Status: AC | PRN
  Administered 2024-02-07 (×3): 1 [drp] via OPHTHALMIC

## 2024-02-07 MED ORDER — MOXIFLOXACIN HCL 0.5 % OP SOLN
OPHTHALMIC | Status: DC | PRN
  Administered 2024-02-07: .2 mL via OPHTHALMIC

## 2024-02-07 MED ORDER — ONDANSETRON HCL 4 MG/2ML IJ SOLN
INTRAMUSCULAR | Status: DC | PRN
  Administered 2024-02-07: 4 mg via INTRAVENOUS

## 2024-02-07 MED ORDER — SIGHTPATH DOSE#1 BSS IO SOLN
INTRAOCULAR | Status: DC | PRN
  Administered 2024-02-07: 50 mL via OPHTHALMIC

## 2024-02-07 MED ORDER — MIDAZOLAM HCL 2 MG/2ML IJ SOLN
INTRAMUSCULAR | Status: AC
  Filled 2024-02-07: qty 2

## 2024-02-07 MED ORDER — SIGHTPATH DOSE#1 BSS IO SOLN
INTRAOCULAR | Status: DC | PRN
  Administered 2024-02-07: 15 mL via INTRAOCULAR

## 2024-02-07 MED ORDER — LIDOCAINE HCL (PF) 2 % IJ SOLN
INTRAOCULAR | Status: DC | PRN
  Administered 2024-02-07: 2 mL

## 2024-02-07 MED ORDER — PHENYLEPHRINE HCL 10 % OP SOLN
1.0000 [drp] | OPHTHALMIC | Status: AC | PRN
  Administered 2024-02-07 (×3): 1 [drp] via OPHTHALMIC

## 2024-02-07 MED ORDER — TETRACAINE HCL 0.5 % OP SOLN
OPHTHALMIC | Status: AC
  Filled 2024-02-07: qty 4

## 2024-02-07 MED ORDER — SIGHTPATH DOSE#1 NA CHONDROIT SULF-NA HYALURON 40-17 MG/ML IO SOLN
INTRAOCULAR | Status: DC | PRN
  Administered 2024-02-07: 1 mL via INTRAOCULAR

## 2024-02-07 MED ORDER — FENTANYL CITRATE (PF) 100 MCG/2ML IJ SOLN
INTRAMUSCULAR | Status: DC | PRN
  Administered 2024-02-07: 50 ug via INTRAVENOUS

## 2024-02-07 NOTE — Anesthesia Preprocedure Evaluation (Signed)
"                                    Anesthesia Evaluation  Patient identified by MRN, date of birth, ID band Patient awake    Reviewed: Allergy & Precautions, H&P , NPO status , Patient's Chart, lab work & pertinent test results  Airway Mallampati: II  TM Distance: >3 FB Neck ROM: Full    Dental no notable dental hx.    Pulmonary neg pulmonary ROS   Pulmonary exam normal breath sounds clear to auscultation       Cardiovascular hypertension, negative cardio ROS Normal cardiovascular exam Rhythm:Regular Rate:Normal     Neuro/Psych negative neurological ROS  negative psych ROS   GI/Hepatic negative GI ROS, Neg liver ROS,,,  Endo/Other  negative endocrine ROS    Renal/GU negative Renal ROS  negative genitourinary   Musculoskeletal negative musculoskeletal ROS (+)    Abdominal   Peds negative pediatric ROS (+)  Hematology negative hematology ROS (+)   Anesthesia Other Findings   Reproductive/Obstetrics negative OB ROS                              Anesthesia Physical Anesthesia Plan  ASA: 2  Anesthesia Plan: MAC   Post-op Pain Management:    Induction: Intravenous  PONV Risk Score and Plan:   Airway Management Planned:   Additional Equipment:   Intra-op Plan:   Post-operative Plan: Extubation in OR  Informed Consent: I have reviewed the patients History and Physical, chart, labs and discussed the procedure including the risks, benefits and alternatives for the proposed anesthesia with the patient or authorized representative who has indicated his/her understanding and acceptance.     Dental advisory given  Plan Discussed with: CRNA  Anesthesia Plan Comments:         Anesthesia Quick Evaluation  "

## 2024-02-07 NOTE — Op Note (Signed)
 PREOPERATIVE DIAGNOSIS:  Nuclear sclerotic cataract of the right eye.   POSTOPERATIVE DIAGNOSIS:  Right Eye Cataract   OPERATIVE PROCEDURE:ORPROCALL@   SURGEON:  Elsie Carmine, MD.   ANESTHESIA:  Anesthesiologist: Maggioncalda, Fairy LABOR, MD CRNA: Levy Harvey, CRNA  1.      Managed anesthesia care. 2.      0.34ml of Shugarcaine was instilled in the eye following the paracentesis.   COMPLICATIONS:  None.   TECHNIQUE:   Stop and chop   DESCRIPTION OF PROCEDURE:  The patient was examined and consented in the preoperative holding area where the aforementioned topical anesthesia was applied to the right eye and then brought back to the Operating Room where the right eye was prepped and draped in the usual sterile ophthalmic fashion and a lid speculum was placed. A paracentesis was created with the side port blade and the anterior chamber was filled with viscoelastic. A near clear corneal incision was performed with the steel keratome. A continuous curvilinear capsulorrhexis was performed with a cystotome followed by the capsulorrhexis forceps. Hydrodissection and hydrodelineation were carried out with BSS on a blunt cannula. The lens was removed in a stop and chop  technique and the remaining cortical material was removed with the irrigation-aspiration handpiece. The capsular bag was inflated with viscoelastic and the intraocular lens was placed in the capsular bag without complication. The remaining viscoelastic was removed from the eye with the irrigation-aspiration handpiece. The wounds were hydrated. The anterior chamber was flushed with BSS and the eye was inflated to physiologic pressure. 0.54ml of Vigamox  was placed in the anterior chamber. The wounds were found to be water tight. The eye was dressed with Combigan . The patient was given protective glasses to wear throughout the day and a shield with which to sleep tonight. The patient was also given drops with which to begin a drop regimen  today and will follow-up with me in one day. Implant Name Type Inv. Item Serial No. Manufacturer Lot No. LRB No. Used Action  LENS IOL TECNIS EYHANCE 20.5 - [id number] Intraocular Lens LENS IOL TECNIS EYHANCE 20.5 [id number] [employer]  Right 1 Implanted   Procedures: PHACOEMULSIFICATION, CATARACT, WITH IOL INSERTION 5.18 00:32.14 (Right)  Electronically signed: Elsie Carmine 02/07/2024 12:18 PM

## 2024-02-07 NOTE — H&P (Signed)
  [facility]   Primary Care Physician:  Diedra Lame, MD Ophthalmologist: Dr. Elsie Carmine  Pre-Procedure History & Physical: HPI:  Morgan Lam is a 69 y.o. female here for cataract surgery.   Past Medical History:  Diagnosis Date   Arthritis    Cortical cataract    DDD (degenerative disc disease), cervical    DDD (degenerative disc disease), cervical    Diverticulitis    Heart murmur    functional murmur found late teens early 20's   Hyperlipemia    Hypertension    Migraines    history of migraines   Pituitary adenoma (HCC)    PONV (postoperative nausea and vomiting)    Vitamin D  deficiency     Past Surgical History:  Procedure Laterality Date   ANTERIOR CERVICAL DECOMP/DISCECTOMY FUSION Left 03/08/2016   Procedure: ANTERIOR CERVICAL DECOMPRESSION/DISCECTOMY FUSION 3 LEVELS;  Surgeon: Elspeth Ahle, MD;  Location: [facility] ORS;  Service: Neurosurgery;  Laterality: Left;   CHOLECYSTECTOMY     COLONOSCOPY WITH PROPOFOL  N/A 12/04/2014   Procedure: COLONOSCOPY WITH PROPOFOL ;  Surgeon: Lamar ONEIDA Holmes, MD;  Location: [facility] ENDOSCOPY;  Service: Endoscopy;  Laterality: N/A;   Degenerative Disc     EYE SURGERY     cataract os   TOTAL HIP ARTHROPLASTY Right 04/12/2016   Procedure: TOTAL HIP ARTHROPLASTY;  Surgeon: Lynwood SHAUNNA Hue, MD;  Location: [facility] ORS;  Service: Orthopedics;  Laterality: Right;   TUBAL LIGATION      Prior to Admission medications  Medication Sig Start Date End Date Taking? Authorizing Provider  Cholecalciferol (D3) 50 MCG (2000 UT) TABS Take 1 tablet by mouth daily.   Yes [provider]  ibandronate (BONIVA) 150 MG tablet Take 150 mg by mouth every 30 (thirty) days. Take in the morning with a full glass of water, on an empty stomach, and do not take anything else by mouth or lie down for the next 30 min.   Yes [provider]  meloxicam (MOBIC) 15 MG tablet Take 15 mg by mouth as needed for pain.   Yes [provider]   amLODipine (NORVASC) 5 MG tablet Take 5 mg by mouth daily. 08/15/23   [provider]  latanoprost (XALATAN) 0.005 % ophthalmic solution 1 drop at bedtime. 08/03/23   [provider]  Multiple Vitamin (MULTIVITAMIN WITH MINERALS) TABS tablet Take 1 tablet by mouth daily. Centrum silver    [provider]    Allergies as of 01/25/2024 - Review Complete 10/04/2023  Allergen Reaction Noted   Acetaminophen -codeine Nausea Only 02/25/2016   Codeine Nausea Only 07/18/2015   Ivp dye [iodinated contrast media] Hives 12/03/2014   Other  03/25/2016    Family History  Problem Relation Age of Onset   Diabetes Maternal Uncle    Stomach cancer Maternal Uncle    Hypertension Mother    Arthritis Mother    Stomach cancer Mother    Cataracts Father    Cancer Neg Hx    Breast cancer Neg Hx    Ovarian cancer Neg Hx    Colon cancer Neg Hx     Social History   Socioeconomic History   Marital status: Single    Spouse name: Not on file   Number of children: Not on file   Years of education: Not on file   Highest education level: Not on file  Occupational History   Not on file  Tobacco Use   Smoking status: Never   Smokeless tobacco: Never  Vaping Use  Vaping status: Never Used  Substance and Sexual Activity   Alcohol use: Yes    Alcohol/week: 0.0 - 2.0 standard drinks of alcohol    Comment: occasionally- only wine- Sunday   Drug use: Never   Sexual activity: Not Currently    Birth control/protection: Post-menopausal, Surgical    Comment: BTL  Other Topics Concern   Not on file  Social History Narrative   Lives at home with husband. Independent at baseline   Social Drivers of Health   Tobacco Use: Low Risk (02/03/2024)   Patient History    Smoking Tobacco Use: Never    Smokeless Tobacco Use: Never    Passive Exposure: Not on file  Financial Resource Strain: Patient Declined (12/01/2023)   Received from [facility]   Overall Financial  Resource Strain (CARDIA)    Difficulty of Paying Living Expenses: Patient declined  Food Insecurity: Patient Declined (12/01/2023)   Received from [facility]   Epic    Within the past 12 months, you worried that your food would run out before you got the money to buy more.: Patient declined    Within the past 12 months, the food you bought just didn't last and you didn't have money to get more.: Patient declined  Transportation Needs: Patient Declined (12/01/2023)   Received from [facility] - Transportation    In the past 12 months, has lack of transportation kept you from medical appointments or from getting medications?: Patient declined    Lack of Transportation (Non-Medical): Patient declined  Physical Activity: Not on file  Stress: Not on file  Social Connections: Not on file  Intimate Partner Violence: Not on file  Depression (EYV7-0): Not on file  Alcohol Screen: Not on file  Housing: Unknown (12/05/2023)   [facility]   Epic    In the last 12 months, was there a time when you were not able to pay the mortgage or rent on time?: Patient declined    Number of Times Moved in the Last Year: Not on file    At any time in the past 12 months, were you homeless or living in a shelter (including now)?: No  Utilities: Patient Declined (12/01/2023)   Received from [facility]   Epic    In the past 12 months has the electric, gas, oil, or water company threatened to shut off services in your home?: Patient declined  Health Literacy: Not on file    Review of Systems: See HPI, otherwise negative ROS  Physical Exam: Ht 5' 6 (1.676 m)   Wt 52.2 kg   BMI 18.56 kg/m  General:   Alert, cooperative. Head:  Normocephalic and atraumatic. Respiratory:  Normal work of breathing. Cardiovascular:  NAD  Impression/Plan: Morgan Lam is here for cataract surgery.  Risks, benefits,  limitations, and alternatives regarding cataract surgery have been reviewed with the patient.  Questions have been answered.  All parties agreeable.   Elsie Carmine, MD  02/07/2024, 10:32 AM

## 2024-02-07 NOTE — Discharge Instructions (Signed)

## 2024-02-07 NOTE — Transfer of Care (Signed)
 Immediate Anesthesia Transfer of Care Note  Patient: Morgan Lam  Procedure(s) Performed: PHACOEMULSIFICATION, CATARACT, WITH IOL INSERTION 5.18 00:32.14 (Right: Eye)  Patient Location: PACU  Anesthesia Type: MAC  Level of Consciousness: awake, alert  and patient cooperative  Airway and Oxygen Therapy: Patient Spontanous Breathing and Patient connected to supplemental oxygen  Post-op Assessment: Post-op Vital signs reviewed, Patient's Cardiovascular Status Stable, Respiratory Function Stable, Patent Airway and No signs of Nausea or vomiting  Post-op Vital Signs: Reviewed and stable  Complications: No notable events documented.

## 2024-02-07 NOTE — Anesthesia Postprocedure Evaluation (Signed)
"   Anesthesia Post Note  Patient: Morgan Lam  Procedure(s) Performed: PHACOEMULSIFICATION, CATARACT, WITH IOL INSERTION 5.18 00:32.14 (Right: Eye)  Patient location during evaluation: PACU Anesthesia Type: MAC Level of consciousness: awake and alert Pain management: pain level controlled Vital Signs Assessment: post-procedure vital signs reviewed and stable Respiratory status: spontaneous breathing, nonlabored ventilation, respiratory function stable and patient connected to nasal cannula oxygen Cardiovascular status: blood pressure returned to baseline and stable Postop Assessment: no apparent nausea or vomiting Anesthetic complications: no   No notable events documented.   Last Vitals:  Vitals:   02/07/24 1114 02/07/24 1219  BP: (!) 142/82 121/72  Pulse: 87 69  Resp: 13 18  Temp: (!) 36.1 C (!) 36.2 C  SpO2: 100% 100%    Last Pain:  Vitals:   02/07/24 1219  TempSrc: Temporal  PainSc:                  Fairy LABOR Biff Rutigliano      "

## 2024-02-08 ENCOUNTER — Encounter: Payer: Self-pay | Admitting: Ophthalmology
